# Patient Record
Sex: Male | Born: 1970 | Race: Black or African American | Hispanic: No | Marital: Single | Smoking: Never smoker
Health system: Southern US, Community
[De-identification: ages and names within clinical notes are randomized; demographics above are authoritative.]

---

## 2020-04-09 ENCOUNTER — Ambulatory Visit (INDEPENDENT_AMBULATORY_CARE_PROVIDER_SITE_OTHER)
Admission: EM | Admit: 2020-04-09 | Discharge: 2020-04-09 | Disposition: A | Discharge status: Home / Self Care | Payer: Commercial Managed Care - PPO | Source: Home / Self Care

## 2020-04-09 ENCOUNTER — Encounter (HOSPITAL_COMMUNITY): Payer: Self-pay

## 2020-04-09 ENCOUNTER — Other Ambulatory Visit: Payer: Self-pay

## 2020-04-09 ENCOUNTER — Emergency Department (HOSPITAL_COMMUNITY)
Admission: EM | Admit: 2020-04-09 | Discharge: 2020-04-10 | Disposition: A | Discharge status: Home / Self Care | Payer: Commercial Managed Care - PPO | Attending: Emergency Medicine | Admitting: Emergency Medicine

## 2020-04-09 DIAGNOSIS — R82998 Other abnormal findings in urine: Secondary | ICD-10-CM

## 2020-04-09 DIAGNOSIS — R1031 Right lower quadrant pain: Secondary | ICD-10-CM | POA: Diagnosis not present

## 2020-04-09 DIAGNOSIS — R34 Anuria and oliguria: Secondary | ICD-10-CM

## 2020-04-09 DIAGNOSIS — N179 Acute kidney failure, unspecified: Secondary | ICD-10-CM | POA: Diagnosis not present

## 2020-04-09 LAB — CBC
HCT: 44.1 % (ref 39.0–52.0)
Hemoglobin: 14 g/dL (ref 13.0–17.0)
MCH: 26.3 pg (ref 26.0–34.0)
MCHC: 31.7 g/dL (ref 30.0–36.0)
MCV: 82.9 fL (ref 80.0–100.0)
Platelets: 381 10*3/uL (ref 150–400)
RBC: 5.32 MIL/uL (ref 4.22–5.81)
RDW: 14.2 % (ref 11.5–15.5)
WBC: 8.3 10*3/uL (ref 4.0–10.5)
nRBC: 0 % (ref 0.0–0.2)

## 2020-04-09 LAB — BASIC METABOLIC PANEL
Anion gap: 12 (ref 5–15)
BUN: 8 mg/dL (ref 6–20)
CO2: 24 mmol/L (ref 22–32)
Calcium: 9 mg/dL (ref 8.9–10.3)
Chloride: 104 mmol/L (ref 98–111)
Creatinine, Ser: 1.3 mg/dL — ABNORMAL HIGH (ref 0.61–1.24)
GFR calc Af Amer: >60 mL/min (ref >60)
GFR calc non Af Amer: >60 mL/min (ref >60)
Glucose, Bld: 92 mg/dL (ref 70–99)
Potassium: 3.4 mmol/L — ABNORMAL LOW (ref 3.5–5.1)
Sodium: 140 mmol/L (ref 135–145)

## 2020-04-09 LAB — URINALYSIS, ROUTINE W REFLEX MICROSCOPIC
Bacteria, UA: NONE SEEN
Bilirubin Urine: NEGATIVE
Glucose, UA: NEGATIVE mg/dL
Hgb urine dipstick: NEGATIVE
Ketones, ur: 20 mg/dL — AB
Leukocytes,Ua: NEGATIVE
Nitrite: NEGATIVE
Protein, ur: 100 mg/dL — AB
Specific Gravity, Urine: 1.028 (ref 1.005–1.030)
pH: 5 (ref 5.0–8.0)

## 2020-04-09 NOTE — ED Triage Notes (Signed)
Pt presents with complaints of decreased urinary output for 3 days. States he has only been going to the bathroom 2 times a day with very little urine output. Pt reports feeling a little sweaty today and right flank pain that started today. States urine varies from a dark color to a light clear color.

## 2020-04-09 NOTE — ED Provider Notes (Signed)
[facility]    CSN: [id number] Arrival date & time: 04/09/20  1539      History   Chief Complaint Chief Complaint  Patient presents with  . Decrease in Urine Output    HPI Sean Shah is a 49 y.o. male.   Patient reports urgent care for decreased urination.  He reports for the last 2 to 3 days he has gradually had less and less urine production.  He reports yesterday he made up to maybe 16 ounces of urine.  He reports the day he made possibly between 80 and 100 mL of urine total.  He reports generally he has to urinate 2-3 times a night however the night before last he did not wake at all.  And yesterday morning he did not have to urinate.  He reports dark color urine, described as brown at times.  He reports he had been seeing dark urine brown at times over the last 2 to 3 weeks.  He reports he does not feel like he needs to urinate at all.  He reports he does not have issues starting to urinate when he does have to urinate.  He also reports some dull right-sided low back pain.  Denies fever, chills.  Denies painful urination.  Denies any chest pain or shortness of breath.  Denies lower extremity swelling.  Reports he has not seen a provider in about 5 years.  Reports he is otherwise been fairly healthy.     History reviewed. No pertinent past medical history.  There are no problems to display for this patient.   History reviewed. No pertinent surgical history.     Home Medications    Prior to Admission medications   Not on File    Family History Family History  Problem Relation Age of Onset  . Cancer Mother   . Vascular Disease Father     Social History Social History   Tobacco Use  . Smoking status: Never Smoker  . Smokeless tobacco: Never Used  Substance Use Topics  . Alcohol use: Yes    Comment: rarely  . Drug use: Never     Allergies   Patient has no known allergies.   Review of Systems Review of Systems   Physical Exam Triage  Vital Signs ED Triage Vitals  Enc Vitals Group     BP 04/09/20 1625 (!) 152/99     Pulse Rate 04/09/20 1625 91     Resp 04/09/20 1625 18     Temp 04/09/20 1625 98.7 F (37.1 C)     Temp Source 04/09/20 1625 Oral     SpO2 04/09/20 1625 99 %     Weight --      Height --      Head Circumference --      Peak Flow --      Pain Score 04/09/20 1641 3     Pain Loc --      Pain Edu? --      Excl. in GC? --    No data found.  Updated Vital Signs BP (!) 152/99 (BP Location: Left Arm)   Pulse 91   Temp 98.7 F (37.1 C) (Oral)   Resp 18   SpO2 99%   Visual Acuity Right Eye Distance:   Left Eye Distance:   Bilateral Distance:    Right Eye Near:   Left Eye Near:    Bilateral Near:     Physical Exam Vitals and nursing note reviewed.  Constitutional:  Appearance: He is well-developed.  HENT:     Head: Normocephalic and atraumatic.  Eyes:     Conjunctiva/sclera: Conjunctivae normal.  Cardiovascular:     Rate and Rhythm: Normal rate and regular rhythm.     Heart sounds: No murmur.  Pulmonary:     Effort: Pulmonary effort is normal. No respiratory distress.  Abdominal:     Tenderness: There is no abdominal tenderness. There is no right CVA tenderness or left CVA tenderness.  Musculoskeletal:     Cervical back: Neck supple.     Right lower leg: No edema.     Left lower leg: No edema.  Skin:    General: Skin is warm and dry.  Neurological:     Mental Status: He is alert.      UC Treatments / Results  Labs (all labs ordered are listed, but only abnormal results are displayed) Labs Reviewed - No data to display  EKG   Radiology No results found.  Procedures Procedures (including critical care time)  Medications Ordered in UC Medications - No data to display  Initial Impression / Assessment and Plan / UC Course  I have reviewed the triage vital signs and the nursing notes.  Pertinent labs & imaging results that were available during my care of the  patient were reviewed by me and considered in my medical decision making (see chart for details).     # oliguria #Dark brown urine Patient is a 49 year old presenting with Foley area and dark urine.  Patient was unable to make any urine in clinic for urinalysis.  Given likely less than 100 mL made an entire day will send to the emergency department for further evaluation for concern of renal failure versus urinary tract obstruction at this time.  Patient has a stable vital signs at this time.  He will self transport to [facility] emergency department for further evaluation.  Final Clinical Impressions(s) / UC Diagnoses   Final diagnoses:  Dark brown urine  Oliguria     Discharge Instructions     Given how little urine you are making, you need to be further evaluated in the Emergency Department .    ED Prescriptions    None     PDMP not reviewed this encounter.   Purnell Shoemaker, PA-C 04/09/20 1721

## 2020-04-09 NOTE — Discharge Instructions (Signed)
Given how little urine you are making, you need to be further evaluated in the Emergency Department .

## 2020-04-09 NOTE — ED Triage Notes (Signed)
Patient complains of decreased urination and decreased urge to urinate x 3 days. Complains of mild right flank pain for same.

## 2020-04-09 NOTE — ED Notes (Signed)
Patient is being discharged from the Urgent Care and sent to the Emergency Department via POV . Per Omer Jack, PA, patient is in need of higher level of care due to inability to urinate. Patient is aware and verbalizes understanding of plan of care.  Vitals:   04/09/20 1625  BP: (!) 152/99  Pulse: 91  Resp: 18  Temp: 98.7 F (37.1 C)  SpO2: 99%

## 2020-04-09 NOTE — ED Notes (Signed)
Patient unable to void 

## 2020-04-10 ENCOUNTER — Emergency Department (HOSPITAL_COMMUNITY): Payer: Commercial Managed Care - PPO

## 2020-04-10 ENCOUNTER — Encounter (HOSPITAL_COMMUNITY): Payer: Self-pay | Admitting: Radiology

## 2020-04-10 MED ORDER — SODIUM CHLORIDE 0.9 % IV BOLUS (SEPSIS)
1000.0000 mL | Freq: Once | INTRAVENOUS | Status: AC
  Administered 2020-04-10: 1000 mL via INTRAVENOUS

## 2020-04-10 NOTE — ED Notes (Signed)
Pt to CT

## 2020-04-10 NOTE — ED Notes (Signed)
 Bladder Scan

## 2020-04-10 NOTE — ED Provider Notes (Signed)
TIME SEEN: 12:14 AM  CHIEF COMPLAINT: Decreased urination for the past several days, right flank pain  HPI: Patient is a 49 year old male with no significant past medical history who presents to the emergency department decreased urination for the past several days.  Reports over the past day he is only urinated twice and it has been less than normal and darker in color.  He does report some mild intermittent right flank pain but none currently.  No fevers, nausea, vomiting, abdominal pain.  Has had some diarrhea for the past 2 weeks but now reports stool is back to normal.  No history of kidney stones.  No history of prostate issues.  He states he feels he has been eating and drinking normally.  He has not been out in the heat recently.  ROS: See HPI Constitutional: no fever  Eyes: no drainage  ENT: no runny nose   Cardiovascular:  no chest pain  Resp: no SOB  GI: no vomiting GU: no dysuria Integumentary: no rash  Allergy: no hives  Musculoskeletal: no leg swelling  Neurological: no slurred speech ROS otherwise negative  PAST MEDICAL HISTORY/PAST SURGICAL HISTORY:  History reviewed. No pertinent past medical history.  MEDICATIONS:  Prior to Admission medications   Not on File    ALLERGIES:  No Known Allergies  SOCIAL HISTORY:  Social History   Tobacco Use  . Smoking status: Never Smoker  . Smokeless tobacco: Never Used  Substance Use Topics  . Alcohol use: Yes    Comment: rarely    FAMILY HISTORY: Family History  Problem Relation Age of Onset  . Cancer Mother   . Vascular Disease Father     EXAM: BP (!) 158/100   Pulse 71   Temp 98.5 F (36.9 C) (Oral)   Resp 19   Ht 6' (1.829 m)   Wt 127 kg   SpO2 100%   BMI 37.97 kg/m  CONSTITUTIONAL: Alert and oriented and responds appropriately to questions. Well-appearing; well-nourished HEAD: Normocephalic EYES: Conjunctivae clear, pupils appear equal, EOM appear intact ENT: normal nose; moist mucous  membranes NECK: Supple, normal ROM CARD: RRR; S1 and S2 appreciated; no murmurs, no clicks, no rubs, no gallops RESP: Normal chest excursion without splinting or tachypnea; breath sounds clear and equal bilaterally; no wheezes, no rhonchi, no rales, no hypoxia or respiratory distress, speaking full sentences ABD/GI: Normal bowel sounds; non-distended; soft, non-tender, no rebound, no guarding, no peritoneal signs, no hepatosplenomegaly BACK:  The back appears normal EXT: Normal ROM in all joints; no deformity noted, no edema; no cyanosis SKIN: Normal color for age and race; warm; no rash on exposed skin NEURO: Moves all extremities equally PSYCH: The patient's mood and manner are appropriate.   MEDICAL DECISION MAKING: Patient here with decreased urination.  Labs reviewed/interpreted and show mild elevation of creatinine at 1.3 with no old for comparison.  Urine shows small amount of ketones but no sign of infection.  Will hydrate patient given his elevated creatinine ketones in his urine, bladder scan and obtained CT renal study to evaluate for stone or other acute abnormality.  Patient has been able to urinate on his own here.  ED PROGRESS: CT scan reviewed/interpreted and shows no acute abnormality.  Will discharge after IV fluids.  He is drinking here without difficulty.  Will provide PCP follow-up information.  Recommended recheck of creatinine in 1 to 2 weeks.  Recommended increase fluid intake at home and avoiding NSAIDs.  He is comfortable with this plan.  He  has no significant urinary retention on bladder scan.  Bladder scan revealed 100 mL.  He has been able to urinate here.   At this time, I do not feel there is any life-threatening condition present. I have reviewed, interpreted and discussed all results (EKG, imaging, lab, urine as appropriate) and exam findings with patient/family. I have reviewed nursing notes and appropriate previous records.  I feel the patient is safe to be  discharged home without further emergent workup and can continue workup as an outpatient as needed. Discussed usual and customary return precautions. Patient/family verbalize understanding and are comfortable with this plan.  Outpatient follow-up has been provided as needed. All questions have been answered.   Sean Shah was evaluated in Emergency Department on 04/10/2020 for the symptoms described in the history of present illness. He was evaluated in the context of the global COVID-19 pandemic, which necessitated consideration that the patient might be at risk for infection with the SARS-CoV-2 virus that causes COVID-19. Institutional protocols and algorithms that pertain to the evaluation of patients at risk for COVID-19 are in a state of rapid change based on information released by regulatory bodies including the CDC and federal and state organizations. These policies and algorithms were followed during the patient's care in the ED.      Pete Merten, Delice Bison, DO 04/10/20 [phone]

## 2020-04-10 NOTE — ED Notes (Signed)
Discharge instructions discussed with pt. Pt. Verbalized instructions with no questions at this time.

## 2020-04-10 NOTE — Discharge Instructions (Addendum)
Your creatinine (kidney function) was slightly elevated at 1.3 today.  We have no old for comparison.  Your urine showed small amount of ketones.  This is all suggestive of mild dehydration which may be contributing to decreased urination.  Please increase your water intake at home and I recommend follow-up with a primary care physician in 1 to 2 weeks to have your labs rechecked.  Otherwise your labs, imaging, urine today were reassuring.  No sign of kidney stone, obstruction, infection.  Steps to find a Primary Care Provider (PCP):  Call [phone] or [phone] to access "[place] Find a Doctor Service."  2.  You may also go on the [facility] website at [email or web address]  3.  [facility] also frequently accepts new patients.  [facility]  [address] [place] [address] [phone]  4.  There are also multiple [facility], [facility] and [facility] practices throughout the [place] that are frequently accepting new patients. You may find a clinic that is close to your home and contact them.  [facility] [email or web address] [phone]  [facility] [email or web address]  [email or web address] [phone]  [facility] [email or web address] [phone]  5.  Local Health Departments also can provide primary care services.  [facility]  [address] [place] [address] [phone]  [facility] Department [address] [place] [address] [phone]  [facility] [address] [place] [address]  [place] [address] [phone]

## 2020-04-11 ENCOUNTER — Other Ambulatory Visit: Payer: Self-pay

## 2020-04-11 ENCOUNTER — Ambulatory Visit (HOSPITAL_COMMUNITY)
Admission: EM | Admit: 2020-04-11 | Discharge: 2020-04-11 | Disposition: A | Discharge status: Home / Self Care | Payer: Commercial Managed Care - PPO | Attending: Internal Medicine | Admitting: Internal Medicine

## 2020-04-11 ENCOUNTER — Encounter (HOSPITAL_COMMUNITY): Payer: Self-pay

## 2020-04-11 DIAGNOSIS — K648 Other hemorrhoids: Secondary | ICD-10-CM | POA: Diagnosis not present

## 2020-04-11 MED ORDER — HYDROCORTISONE ACETATE 25 MG RE SUPP
25.0000 mg | Freq: Two times a day (BID) | RECTAL | 0 refills | Status: DC
Start: 2020-04-11 — End: 2021-07-09

## 2020-04-11 NOTE — ED Triage Notes (Signed)
Pt c/o bloody stools this AM, has history of hemorrhoids but states today was more blood than usual. Denies pain. Also reports having diarrhea x 2-3 weeks

## 2020-04-11 NOTE — ED Provider Notes (Signed)
[facility]    CSN: [id number] Arrival date & time: 04/11/20  0801      History   Chief Complaint Chief Complaint  Patient presents with  . Rectal Bleeding    HPI Sean Shah is a 49 y.o. male comes to the urgent care with complaints of blood in stool this morning.  Patient says that the blood was on the stool.  No pain with bowel movement.  He also saw some blood on the toilet paper.  No abdominal pain, nausea or vomiting.  Patient has experienced diarrhea over the past 2 to 3 weeks.  This is the first episode of such bleeding.  No weight loss.  No dizziness.  Patient has no history of hemorrhoids.  Last colonoscopy was 9 years ago.  Patient was seen in the emergency department for kidney stone and he had CT of the abdomen done which was unremarkable.  No colonic masses noted.   HPI  History reviewed. No pertinent past medical history.  There are no problems to display for this patient.   History reviewed. No pertinent surgical history.     Home Medications    Prior to Admission medications   Medication Sig Start Date End Date Taking? Authorizing Provider  hydrocortisone (ANUSOL-HC) 25 MG suppository Place 1 suppository (25 mg total) rectally 2 (two) times daily. 04/11/20   LampteyBritta Mccreedy, MD    Family History Family History  Problem Relation Age of Onset  . Cancer Mother   . Vascular Disease Father     Social History Social History   Tobacco Use  . Smoking status: Never Smoker  . Smokeless tobacco: Never Used  Substance Use Topics  . Alcohol use: Yes    Comment: rarely  . Drug use: Never     Allergies   Patient has no known allergies.   Review of Systems Review of Systems  Constitutional: Negative for activity change, diaphoresis, fatigue and fever.  Respiratory: Negative.   Gastrointestinal: Positive for blood in stool. Negative for abdominal pain, diarrhea, nausea and vomiting.  Genitourinary: Negative.   Musculoskeletal: Negative.    Neurological: Negative for dizziness, light-headedness and headaches.     Physical Exam Triage Vital Signs ED Triage Vitals  Enc Vitals Group     BP 04/11/20 0816 (!) 167/106     Pulse Rate 04/11/20 0816 89     Resp 04/11/20 0816 16     Temp 04/11/20 0816 98.7 F (37.1 C)     Temp src --      SpO2 04/11/20 0816 99 %     Weight --      Height --      Head Circumference --      Peak Flow --      Pain Score 04/11/20 0817 0     Pain Loc --      Pain Edu? --      Excl. in GC? --    No data found.  Updated Vital Signs BP (!) 167/106   Pulse 89   Temp 98.7 F (37.1 C)   Resp 16   SpO2 99%   Visual Acuity Right Eye Distance:   Left Eye Distance:   Bilateral Distance:    Right Eye Near:   Left Eye Near:    Bilateral Near:     Physical Exam Vitals and nursing note reviewed.  Constitutional:      Appearance: Normal appearance.  Cardiovascular:     Rate and Rhythm: Normal rate and  regular rhythm.  Pulmonary:     Effort: Pulmonary effort is normal.     Breath sounds: Normal breath sounds.  Abdominal:     General: Bowel sounds are normal.     Palpations: Abdomen is soft.     Tenderness: There is no abdominal tenderness.  Neurological:     Mental Status: He is alert.      UC Treatments / Results  Labs (all labs ordered are listed, but only abnormal results are displayed) Labs Reviewed - No data to display  EKG   Radiology CT Renal Stone Study  Result Date: 04/10/2020 CLINICAL DATA:  Right-sided flank pain with decreased urine output EXAM: CT ABDOMEN AND PELVIS WITHOUT CONTRAST TECHNIQUE: Multidetector CT imaging of the abdomen and pelvis was performed following the standard protocol without IV contrast. COMPARISON:  None. FINDINGS: Lower chest: No acute abnormality. Hepatobiliary: Fatty infiltration of the liver is noted. The gallbladder is within normal limits. Pancreas: Unremarkable. No pancreatic ductal dilatation or surrounding inflammatory changes.  Spleen: Normal in size without focal abnormality. Adrenals/Urinary Tract: Adrenal glands are unremarkable. Kidneys demonstrate no renal calculi or obstructive changes. The ureters are within normal limits bilaterally. The bladder is partially distended. Stomach/Bowel: No obstructive or inflammatory changes of the colon are seen. The appendix is within normal limits. Small bowel and stomach are unremarkable. Vascular/Lymphatic: No significant vascular findings are present. No enlarged abdominal or pelvic lymph nodes. Reproductive: Prostate is unremarkable. Other: No abdominal wall hernia or abnormality. No abdominopelvic ascites. Musculoskeletal: No acute or significant osseous findings. IMPRESSION: No acute abnormality noted. Electronically Signed   By: Inez Catalina M.D.   On: 04/10/2020 00:55    Procedures Procedures (including critical care time)  Medications Ordered in UC Medications - No data to display  Initial Impression / Assessment and Plan / UC Course  I have reviewed the triage vital signs and the nursing notes.  Pertinent labs & imaging results that were available during my care of the patient were reviewed by me and considered in my medical decision making (see chart for details).     1.  Bleeding internal hemorrhoids: CBC done yesterday showed a hemoglobin of 14.0. Renal function was normal BUN was normal. CT scan of the abdomen done yesterday showed normal colon  Final Clinical Impressions(s) / UC Diagnoses   Final diagnoses:  Bleeding internal hemorrhoids   Discharge Instructions   None    ED Prescriptions    Medication Sig Dispense Auth. Provider   hydrocortisone (ANUSOL-HC) 25 MG suppository Place 1 suppository (25 mg total) rectally 2 (two) times daily. 12 suppository Kaija Kovacevic, Myrene Galas, MD     PDMP not reviewed this encounter.   Chase Picket, MD 04/12/20 1037

## 2020-11-28 ENCOUNTER — Ambulatory Visit (HOSPITAL_COMMUNITY)
Admission: EM | Admit: 2020-11-28 | Discharge: 2020-11-28 | Disposition: A | Discharge status: Home / Self Care | Payer: Self-pay | Attending: Family Medicine | Admitting: Family Medicine

## 2020-11-28 ENCOUNTER — Other Ambulatory Visit: Payer: Self-pay

## 2020-11-28 ENCOUNTER — Encounter (HOSPITAL_COMMUNITY): Payer: Self-pay

## 2020-11-28 DIAGNOSIS — R030 Elevated blood-pressure reading, without diagnosis of hypertension: Secondary | ICD-10-CM

## 2020-11-28 MED ORDER — AMLODIPINE BESYLATE 5 MG PO TABS
5.0000 mg | ORAL_TABLET | Freq: Every day | ORAL | 1 refills | Status: DC
Start: 2020-11-28 — End: 2021-01-15

## 2020-11-28 NOTE — ED Provider Notes (Signed)
[facility]    CSN: [id number] Arrival date & time: 11/28/20  0848      History   Chief Complaint Chief Complaint  Patient presents with  . Hypertension    HPI Sean Shah is a 50 y.o. male.   Here today with concerns regarding his blood pressure, which he states has been high for several years or more but he has never sought care prior to this. States the readings have been higher lately which caused him concern. Had a headache a week or so ago but nothing since. Denies CP, SOB, dizziness, current headaches, visual changes. States he's never been diagnosed with HTN or treated with anything. Does not have a PCP or insurance at the moment.      History reviewed. No pertinent past medical history.  There are no problems to display for this patient.   History reviewed. No pertinent surgical history.     Home Medications    Prior to Admission medications   Medication Sig Start Date End Date Taking? Authorizing Provider  amLODipine (NORVASC) 5 MG tablet Take 1 tablet (5 mg total) by mouth daily. 11/28/20  Yes Particia Nearing, PA-C  hydrocortisone (ANUSOL-HC) 25 MG suppository Place 1 suppository (25 mg total) rectally 2 (two) times daily. 04/11/20   LampteyBritta Mccreedy, MD    Family History Family History  Problem Relation Age of Onset  . Cancer Mother   . Vascular Disease Father     Social History Social History   Tobacco Use  . Smoking status: Never Smoker  . Smokeless tobacco: Never Used  Substance Use Topics  . Alcohol use: Yes    Comment: rarely  . Drug use: Never     Allergies   Patient has no known allergies.   Review of Systems Review of Systems PER HPI    Physical Exam Triage Vital Signs ED Triage Vitals  Enc Vitals Group     BP 11/28/20 1016 (!) 195/144     Pulse Rate 11/28/20 1016 90     Resp 11/28/20 1016 18     Temp 11/28/20 1016 98.8 F (37.1 C)     Temp Source 11/28/20 1016 Oral     SpO2 11/28/20 1016 96 %      Weight --      Height --      Head Circumference --      Peak Flow --      Pain Score 11/28/20 1015 0     Pain Loc --      Pain Edu? --      Excl. in GC? --    No data found.  Updated Vital Signs BP (!) 195/144 (BP Location: Right Arm)   Pulse 90   Temp 98.8 F (37.1 C) (Oral)   Resp 18   SpO2 96%   Visual Acuity Right Eye Distance:   Left Eye Distance:   Bilateral Distance:    Right Eye Near:   Left Eye Near:    Bilateral Near:     Physical Exam Vitals and nursing note reviewed.  Constitutional:      Appearance: Normal appearance.  HENT:     Head: Atraumatic.  Eyes:     Extraocular Movements: Extraocular movements intact.     Conjunctiva/sclera: Conjunctivae normal.  Cardiovascular:     Rate and Rhythm: Normal rate and regular rhythm.     Heart sounds: Normal heart sounds.  Pulmonary:     Effort: Pulmonary effort is normal. No respiratory  distress.     Breath sounds: Normal breath sounds.  Abdominal:     General: Bowel sounds are normal.     Palpations: Abdomen is soft.  Musculoskeletal:        General: Normal range of motion.     Cervical back: Normal range of motion and neck supple.  Skin:    General: Skin is warm and dry.  Neurological:     General: No focal deficit present.     Mental Status: He is oriented to person, place, and time.  Psychiatric:        Mood and Affect: Mood normal.        Thought Content: Thought content normal.        Judgment: Judgment normal.      UC Treatments / Results  Labs (all labs ordered are listed, but only abnormal results are displayed) Labs Reviewed - No data to display  EKG   Radiology No results found.  Procedures Procedures (including critical care time)  Medications Ordered in UC Medications - No data to display  Initial Impression / Assessment and Plan / UC Course  I have reviewed the triage vital signs and the nursing notes.  Pertinent labs & imaging results that were available during my  care of the patient were reviewed by me and considered in my medical decision making (see chart for details).     Asymptomatic currently, well appearing on exam. BP significantly elevated today. Looking back it appears to be elevated historically at each visit to a lesser extent as well. Will start amlodipine, have him log home readings, and establish care ASAP with PCP for recheck and ongoing mgmt. He declines labs today though this was recommended. Strict return precautions, lifestyle changes reviewed.   Final Clinical Impressions(s) / UC Diagnoses   Final diagnoses:  Elevated blood-pressure reading without diagnosis of hypertension   Discharge Instructions   None    ED Prescriptions    Medication Sig Dispense Auth. Provider   amLODipine (NORVASC) 5 MG tablet Take 1 tablet (5 mg total) by mouth daily. 30 tablet Particia Nearing, [place]     PDMP not reviewed this encounter.   Particia Nearing, [place] 11/28/20 1049

## 2020-11-28 NOTE — ED Triage Notes (Signed)
Pt presents for elevated blood pressure. Pt can  Not remember the BP he checked at home. States had headache  4 days ago. Currently denies vision changes, headaches, nausea, chest pain, dizziness.  States he wants prescription for elevated high blood pressure, he never had any prescription before.

## 2021-01-15 ENCOUNTER — Other Ambulatory Visit: Payer: Self-pay

## 2021-01-15 ENCOUNTER — Ambulatory Visit: Payer: Self-pay | Admitting: Student

## 2021-01-15 ENCOUNTER — Encounter: Payer: Self-pay | Admitting: Student

## 2021-01-15 ENCOUNTER — Other Ambulatory Visit (HOSPITAL_COMMUNITY): Payer: Self-pay | Admitting: Student

## 2021-01-15 VITALS — BP 160/101 | HR 76 | Temp 98.4°F | Ht 72.0 in | Wt 274.4 lb

## 2021-01-15 DIAGNOSIS — I10 Essential (primary) hypertension: Secondary | ICD-10-CM

## 2021-01-15 MED ORDER — AMLODIPINE BESYLATE 10 MG PO TABS: 10.0000 mg | ORAL_TABLET | Freq: Every day | ORAL | 2 refills | Status: DC

## 2021-01-15 MED FILL — AMLODIPINE BESYLATE 10 MG T: 10 | 30 days supply | Qty: 30 | Fill #0

## 2021-01-15 NOTE — Progress Notes (Signed)
   CC: Elevated blood pressure  HPI:  [name] is a 50 y.o. with no past medical history who came to our clinic to establish care and hypertension management.  Patient was told that he had high blood pressure when he went to his colonoscopy.  Patient recently came to the ED in January for his blood pressure.  Blood pressure checked then was 195/144.  He was asymptomatic.  ED provider started him on amlodipine 5 mg, which patient is compliant with.  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 No pertinent past medical history.   Review of Systems:    Review of Systems  Constitutional: Negative.   Eyes: Negative.   Respiratory: Negative for shortness of breath.   Cardiovascular: Negative for chest pain and leg swelling.  Neurological: Negative for weakness.    Physical Exam:  Vitals:   01/15/21 1353 01/15/21 1425  BP: (!) 154/99 (!) 160/101  Pulse: 86 76  Temp: 98.4 F (36.9 C)   TempSrc: Oral   SpO2: 100%   Weight: 274 lb 6.4 oz (124.5 kg)   Height: 6' (1.829 m)    Physical Exam Constitutional:      General: He is not in acute distress.    Appearance: Normal appearance.  HENT:     Head: Normocephalic.  Eyes:     General:        Right eye: No discharge.        Left eye: No discharge.  Cardiovascular:     Rate and Rhythm: Normal rate and regular rhythm.  Pulmonary:     Effort: Pulmonary effort is normal. No respiratory distress.  Abdominal:     General: Bowel sounds are normal.     Tenderness: There is no abdominal tenderness.  Musculoskeletal:     Right lower leg: No edema.     Left lower leg: No edema.  Skin:    General: Skin is warm.  Neurological:     Mental Status: He is alert.  Psychiatric:        Mood and Affect: Mood normal.      Assessment & Plan:   See Encounters Tab for problem based charting.  Patient discussed with Dr. Heide Spark

## 2021-01-15 NOTE — Assessment & Plan Note (Addendum)
Patient was found to have elevated blood pressure in multiple settings.  He was started on amlodipine 5 mg in the ED.  Patient reports compliant with his medication.  Blood pressure checked in the clinic was 154/99.  We had a long discussion about the risk of having elevated blood pressure.  I explained to him that he will require 2 medications to control his blood pressure.  Patient was reluctant with taking 2 medications for blood pressure.  He requests to increase his amlodipine to 10 mg.  Patient was advised to reduce salt intake and increase exercise.  Patient also had a blood pressure log to keep at home.  Assessment and plan Will increase amlodipine to 10 mg.  Advised patient to keep monitoring blood pressure at home and bring his logs to the next visit.  His BMP last year showed creatinine of 1.3 with unknown baseline.  His serum creatinine can be elevated secondary to increased muscle mass.  We will repeat BMP when he has an orange card.  -Amlodipine 10 mg -BMP, A1c, lipid panel next visit

## 2021-01-15 NOTE — Patient Instructions (Addendum)
Sean Shah,  It is a pleasure seeing you in the clinic today.    Elevated blood pressure: I will increase your amlodipine to 10 mg.  Please measure your blood pressure at home and bring a blood pressure log with you to the next visit.  You can pick up your medication at the outpatient pharmacy.  It should cost $4.  When you are approved for orange card, we will get some blood work to check for cholesterol, sugar and kidney function.  Please call the office for any questions or concerns   Dr. Cyndie Chime  Please set up an appointment with our financial counselor for orange card

## 2021-01-17 ENCOUNTER — Other Ambulatory Visit: Payer: Self-pay | Admitting: Family Medicine

## 2021-01-17 NOTE — Progress Notes (Signed)
Internal Medicine Clinic Attending  Case discussed with Dr. Nguyen  At the time of the visit.  We reviewed the resident's history and exam and pertinent patient test results.  I agree with the assessment, diagnosis, and plan of care documented in the resident's note. 

## 2021-02-17 ENCOUNTER — Other Ambulatory Visit (HOSPITAL_COMMUNITY): Payer: Self-pay

## 2021-02-17 MED FILL — Amlodipine Besylate Tab 10 MG (Base Equivalent): ORAL | 30 days supply | Qty: 30 | Fill #0 | Status: AC

## 2021-03-18 ENCOUNTER — Other Ambulatory Visit (HOSPITAL_COMMUNITY): Payer: Self-pay

## 2021-03-18 MED FILL — Amlodipine Besylate Tab 10 MG (Base Equivalent): ORAL | 30 days supply | Qty: 30 | Fill #1 | Status: AC

## 2021-03-28 ENCOUNTER — Encounter: Payer: Self-pay | Admitting: Student

## 2021-03-30 IMAGING — CT CT RENAL STONE PROTOCOL
2 of 4 series · 17 of 46 positions shown, 19 images · non-contrast
Comparison: None.

CLINICAL DATA: Right-sided flank pain with decreased urine output

EXAM:
CT ABDOMEN AND PELVIS WITHOUT CONTRAST
TECHNIQUE: Multidetector CT imaging of the abdomen and pelvis was performed
following the standard protocol without IV contrast.

[Series 3: ap without · axial · non-contrast · 0.86mm/px · z∈[+644,+1079]mm · 14 of 99 slices shown, 16 images]
[im 6/99  soft-tissue]
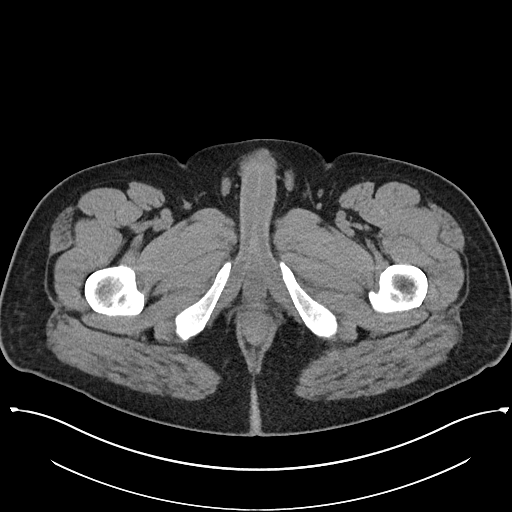
[im 6/99  bone]
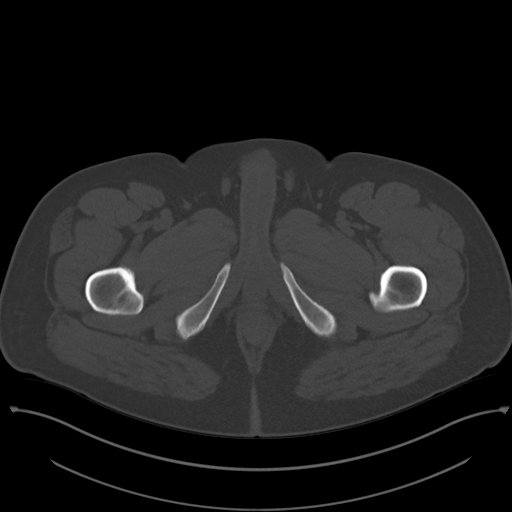
[im 11/99  soft-tissue]
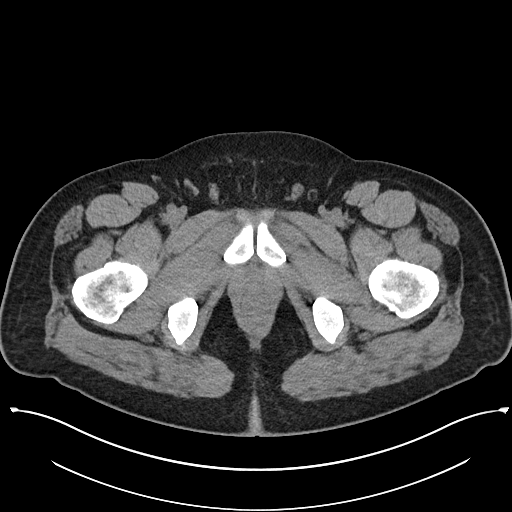
[im 22/99  soft-tissue]
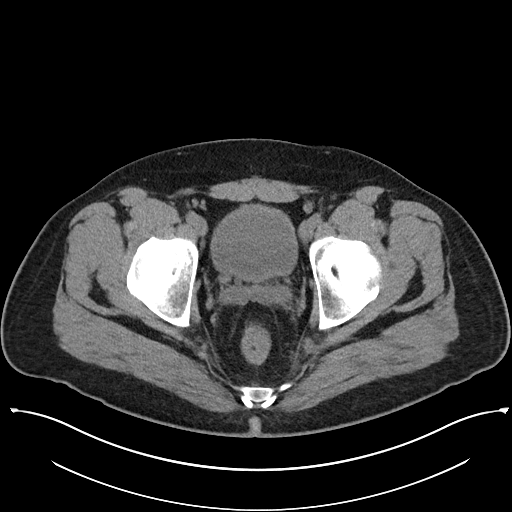
[im 28/99  soft-tissue]
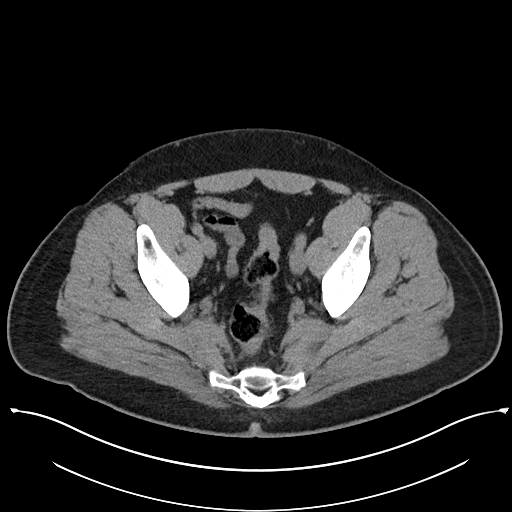
[im 33/99  soft-tissue]
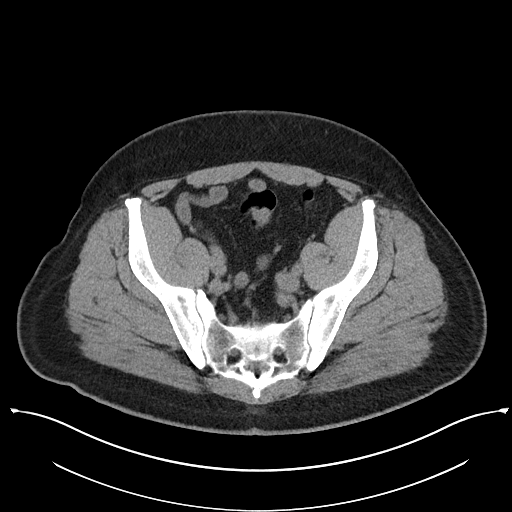
[im 39/99  soft-tissue]
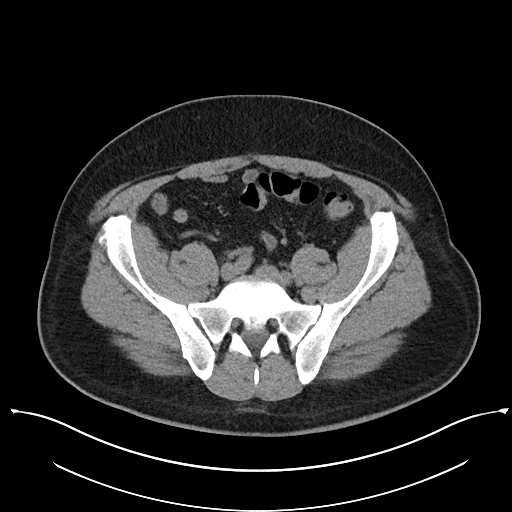
[im 44/99  soft-tissue]
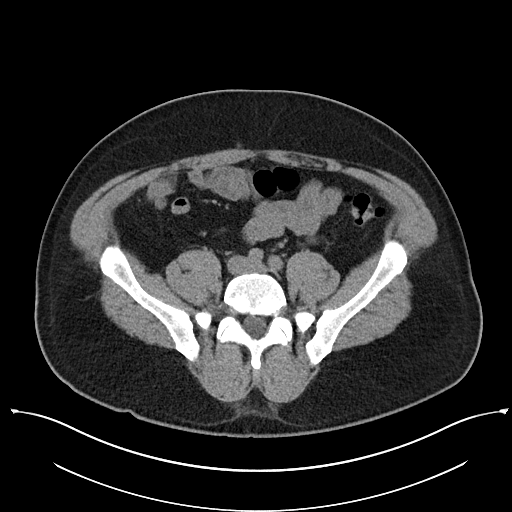
[im 55/99  soft-tissue]
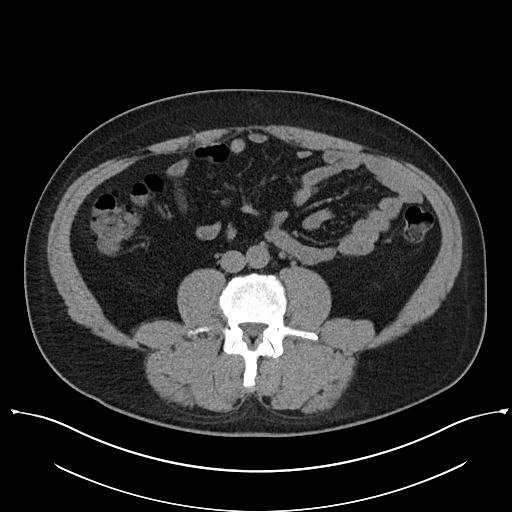
[im 60/99  soft-tissue]
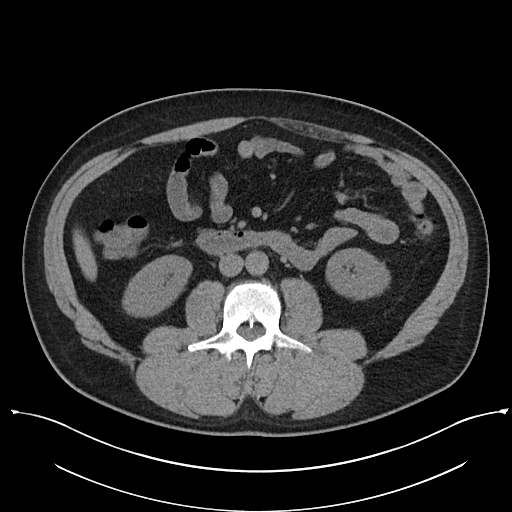
[im 60/99  bone]
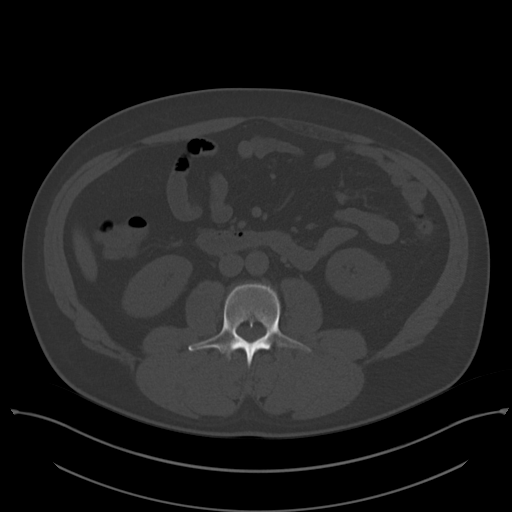
[im 66/99  soft-tissue]
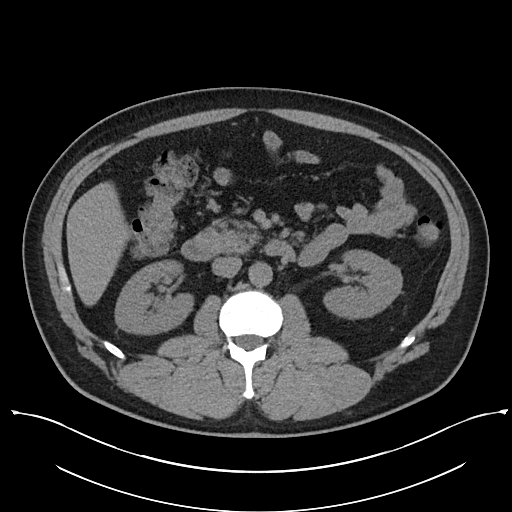
[im 71/99  soft-tissue]
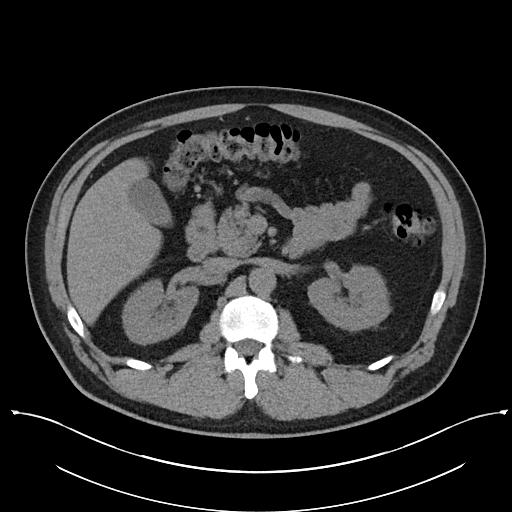
[im 77/99  soft-tissue]
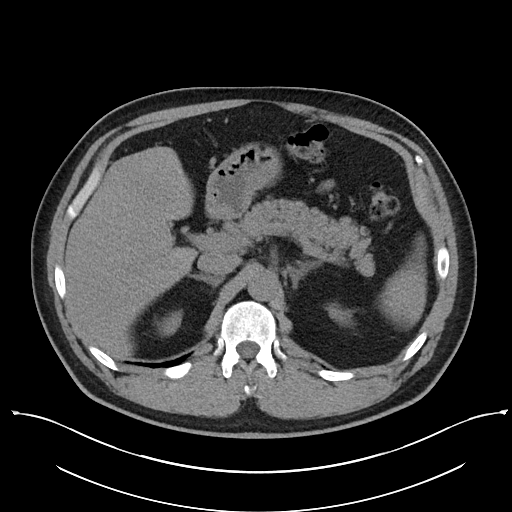
[im 88/99  soft-tissue]
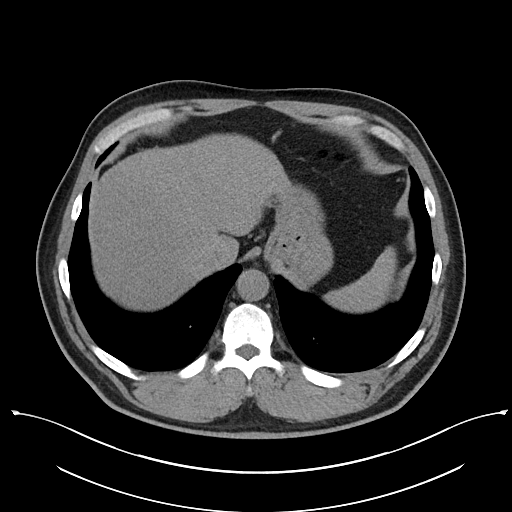
[im 93/99  soft-tissue]
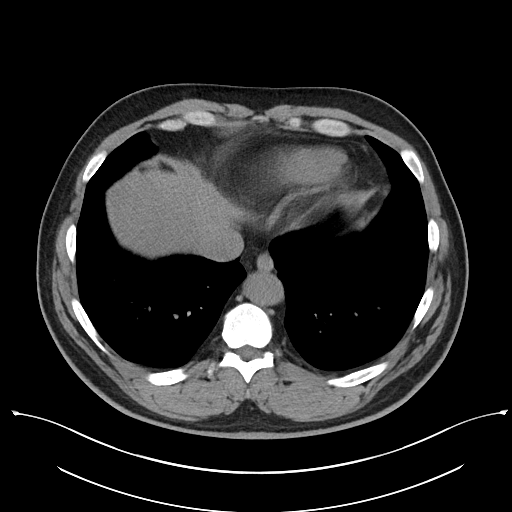

[Series 6: cor · coronal · 0.87mm/px · 3 of 107 slices shown]
[im 36/107  soft-tissue]
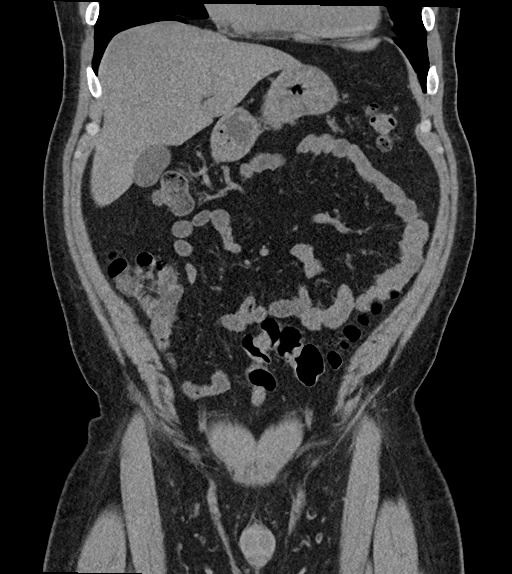
[im 48/107  soft-tissue]
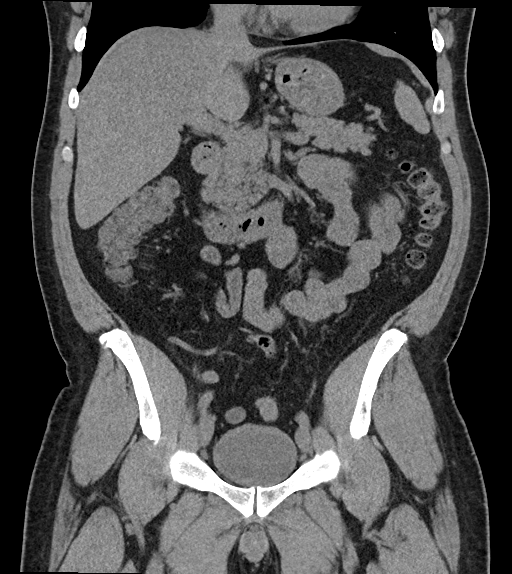
[im 59/107  soft-tissue]
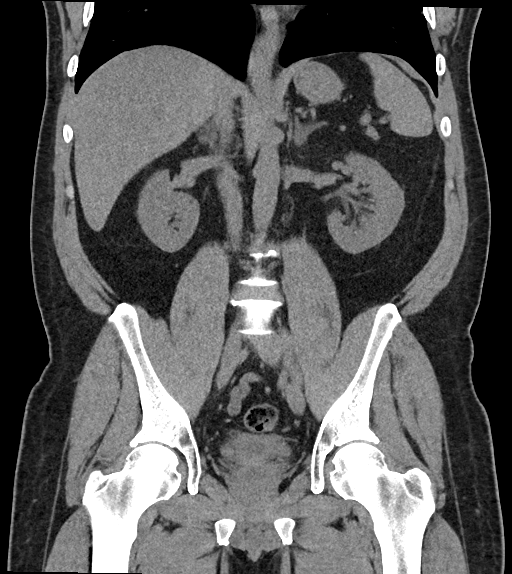

[17 of 46 positions shown; findings below may reference images not displayed]

FINDINGS: Lower chest: No acute abnormality.

Hepatobiliary: Fatty infiltration of the liver is noted. The
gallbladder is within normal limits.

Pancreas: Unremarkable. No pancreatic ductal dilatation or
surrounding inflammatory changes.

Spleen: Normal in size without focal abnormality.

Adrenals/Urinary Tract: Adrenal glands are unremarkable. Kidneys
demonstrate no renal calculi or obstructive changes. The ureters are
within normal limits bilaterally. The bladder is partially
distended.

Stomach/Bowel: No obstructive or inflammatory changes of the colon
are seen. The appendix is within normal limits. Small bowel and
stomach are unremarkable.

Vascular/Lymphatic: No significant vascular findings are present. No
enlarged abdominal or pelvic lymph nodes.

Reproductive: Prostate is unremarkable.

Other: No abdominal wall hernia or abnormality. No abdominopelvic
ascites.

Musculoskeletal: No acute or significant osseous findings.
IMPRESSION: No acute abnormality noted.

## 2021-04-08 ENCOUNTER — Other Ambulatory Visit (HOSPITAL_COMMUNITY): Payer: Self-pay

## 2021-04-08 ENCOUNTER — Other Ambulatory Visit: Payer: Self-pay

## 2021-04-08 ENCOUNTER — Encounter: Payer: Self-pay | Admitting: Student

## 2021-04-08 ENCOUNTER — Ambulatory Visit: Payer: 59 | Admitting: Student

## 2021-04-08 VITALS — BP 153/102 | HR 77 | Temp 98.5°F | Ht 72.0 in | Wt 282.6 lb

## 2021-04-08 DIAGNOSIS — E785 Hyperlipidemia, unspecified: Secondary | ICD-10-CM | POA: Diagnosis not present

## 2021-04-08 DIAGNOSIS — N529 Male erectile dysfunction, unspecified: Secondary | ICD-10-CM | POA: Diagnosis not present

## 2021-04-08 DIAGNOSIS — Z0000 Encounter for general adult medical examination without abnormal findings: Secondary | ICD-10-CM | POA: Diagnosis not present

## 2021-04-08 DIAGNOSIS — I10 Essential (primary) hypertension: Secondary | ICD-10-CM | POA: Diagnosis not present

## 2021-04-08 LAB — GLUCOSE, CAPILLARY: Glucose-Capillary: 107 mg/dL — ABNORMAL HIGH (ref 70–99)

## 2021-04-08 LAB — POCT GLYCOSYLATED HEMOGLOBIN (HGB A1C): Hemoglobin A1C: 6.2 % — AB (ref 4.0–5.6)

## 2021-04-08 MED ORDER — AMLODIPINE-OLMESARTAN 5-20 MG PO TABS
1.0000 | ORAL_TABLET | Freq: Every day | ORAL | 3 refills | Status: DC
  Filled 2021-04-08: qty 30, 30d supply, fill #0

## 2021-04-08 MED ORDER — AMLODIPINE BESYLATE-VALSARTAN 5-160 MG PO TABS
1.0000 | ORAL_TABLET | Freq: Every day | ORAL | 3 refills | Status: DC
  Filled 2021-04-08: qty 30, 30d supply, fill #0
  Filled 2021-05-07: qty 30, 30d supply, fill #1
  Filled 2021-06-06: qty 30, 30d supply, fill #2
  Filled 2021-07-07: qty 30, 30d supply, fill #3

## 2021-04-08 MED ORDER — SILDENAFIL CITRATE 25 MG PO TABS
25.0000 mg | ORAL_TABLET | Freq: Every day | ORAL | 0 refills | Status: AC | PRN
  Filled 2021-04-08: qty 20, 20d supply, fill #0

## 2021-04-08 NOTE — Patient Instructions (Signed)
Thank you, Mr.Keyler Merriott for allowing [place] to provide your care today. Today we discussed   High blood pressure We will be switching you over to a combination pill, amlodipine-olmesartan.  Please take 1 pill daily for 2 to 3 weeks.  At this time please increase to 2 pills daily.  If you find her self getting lightheaded or dizzy please call our clinic.  If you do of these episodes lightheadedness or dizziness please check your blood pressure at home and keep a recording of these.  Erectile difficulties We will be starting you on sildenafil, a medication to help with maintaining erection.  Please take 1 pill prior to sexual activity.  Please take on empty stomach.  If you have any side effects from this medication please give [place] a call.  If you feel as though you are maintaining erection for too long please go to the closest emergency department.  Do not take this medication with the nitroglycerin tablet replaced.    I have ordered the following labs for you:   Lab Orders     BMP8+Anion Gap     Lipid Profile   Referrals ordered today:   Referral Orders  No referral(s) requested today     I have ordered the following medication/changed the following medications:   Stop the following medications: There are no discontinued medications.   Start the following medications: No orders of the defined types were placed in this encounter.    Follow up: 2-3 months blood pressure recheck    Should you have any questions or concerns please call the internal medicine clinic at [phone].     Thalia Bloodgood, D.O. [facility]

## 2021-04-08 NOTE — Assessment & Plan Note (Addendum)
Assessment: Assessing patient for HLD and DM today with lipid panel and A1c.  Patient agrees to be screened for HIV as well as hepatitis C at next visit and will receive tetanus vaccine.  Patient requested tetanus vaccine today, however left before receiving.  Patient also endorses having colonoscopy the past 2 years.  Plan: -HLD, DM screening today -HIV, hepatitis C, tetanus that vaccine at next visit -Patient would not like to discuss COVID vaccines at this time   Addendum 04/09/2021 Patient's LDL resulted with total cholesterol of 199.  Considering the LDL cholesterol is 190 we will plan to start patient on statin therapy after assessing liver enzymes.  Hepatic function panel pending at this time.  Discussed this with the patient is willing to start statin therapy, will plan to start him on low-dose of high intensity statin if liver enzymes normal.  04/10/2021 Patient's liver enzymes unremarkable, will start low-dose of high intensity statin, rosuvastatin 20 daily.  Will call patient and inform him of this.

## 2021-04-08 NOTE — Assessment & Plan Note (Addendum)
BP Readings from Last 3 Encounters:  04/08/21 (!) 153/102  01/15/21 (!) 160/101  11/28/20 (!) 195/144    Assessment: Patient continues to have persistently elevated BP despite increasing amlodipine to 10 mg daily. Patient more willing to start additional BP medications since increasing amlodipine to 10 mg. Patient willing to start combination pill, will start arb-amlodipine combination. Patient to follow up in 2 months for BP recheck.  Patient also with abnormal creatinine at last visit, plan to recheck today.  Plan: -start amlodipine-valsartan 5-160 mg tab daily for 2-3 weeks, then take 2 tabs daily.  -patient instructed to watch out for lightheadedness/dizziness -instructed to take home bp readings and bring in cuff at next visit. -BMP pending  Addendum : Patient's insurance does not cover amlodipine-olmesartan, will switch to amlodipine valsartan.

## 2021-04-08 NOTE — Progress Notes (Signed)
   CC: hypertension, erectile dysfunction  HPI:  Sean Shah is a 50 y.o. male with a past medical history stated below and presents today for follow up managing his hypertension and with new concern for erectile dysfunction.. Please see problem based assessment and plan for additional details.  No past medical history on file.  Current Outpatient Medications on File Prior to Visit  Medication Sig Dispense Refill  . hydrocortisone (ANUSOL-HC) 25 MG suppository Place 1 suppository (25 mg total) rectally 2 (two) times daily. 12 suppository 0   No current facility-administered medications on file prior to visit.    Family History  Problem Relation Age of Onset  . Cancer Mother   . Vascular Disease Father     Social History   Socioeconomic History  . Marital status: Single    Spouse name: Not on file  . Number of children: Not on file  . Years of education: Not on file  . Highest education level: Not on file  Occupational History  . Not on file  Tobacco Use  . Smoking status: Never Smoker  . Smokeless tobacco: Never Used  Substance and Sexual Activity  . Alcohol use: Yes    Comment: rarely  . Drug use: Never  . Sexual activity: Yes  Other Topics Concern  . Not on file  Social History Narrative  . Not on file   Social Determinants of Health   Financial Resource Strain: Not on file  Food Insecurity: Not on file  Transportation Needs: Not on file  Physical Activity: Not on file  Stress: Not on file  Social Connections: Not on file  Intimate Partner Violence: Not on file    Review of Systems: ROS negative except for what is noted on the assessment and plan.  Vitals:   04/08/21 1011 04/08/21 1013 04/08/21 1123  BP: (!) 158/101 (!) 145/97 (!) 153/102  Pulse: 94 90 77  Temp: 98.5 F (36.9 C)    TempSrc: Oral    SpO2: 100%    Weight: 282 lb 9.6 oz (128.2 kg)    Height: 6' (1.829 m)       Physical Exam: Constitutional: well appearing male, in no acute  distress HENT: normocephalic atraumatic Eyes: conjunctiva non-erythematous Neck: supple Cardiovascular: regular rate and rhythm, no m/r/g Pulmonary/Chest: normal work of breathing on room air, lungs clear to auscultation bilaterally MSK: normal bulk and tone Neurological: alert & oriented x 3 Skin: warm and dry Psych: Normal mood and thought process   Assessment & Plan:   See Encounters Tab for problem based charting.  Patient discussed with Dr. Avie Echevaria, D.O. [facility] Internal Medicine, PGY-1 Pager: [phone], Phone: [phone] Date 04/08/2021 Time 2:31 PM

## 2021-04-08 NOTE — Assessment & Plan Note (Addendum)
Assessment: Patient states for the last few months difficulty with erectile dysfunction.  He notes that he has had less frequent morning erections.  He is also had difficulties maintaining erection while masturbating, however, he is still able to ejaculate.  He is also had recent intimate partner and when they began to partake in intercourse he was this is initial erection.  Patient denies ever having similar symptoms in the past.  I do believe that this is consistent with erectile dysfunction.  Low suspicion for hypogonadism or hypotestosteronism at this time.  Suspect vascular etiology at this time, with patient's uncontrolled hypertension.  We are also doing a lipid panel and A1c to assess for other risk factors for vascular disease.  We will start patient on sildenafil 25 mg daily.  Plan: -Start sildenafil 25 mg daily. -If patient starts developing signs of hypotestosteronism/hypogonadism, will evaluate further  Addendum Patient with hypercholesterolemia and suspect this along with hypertension are contributing to his secondary to vascular disease.  Statin therapy and continue to work to manage hypertension.

## 2021-04-09 LAB — LIPID PANEL
Chol/HDL Ratio: 5.8 ratio — ABNORMAL HIGH (ref 0.0–5.0)
Cholesterol, Total: 263 mg/dL — ABNORMAL HIGH (ref 100–199)
HDL: 45 mg/dL (ref >39)
LDL Chol Calc (NIH): 199 mg/dL — ABNORMAL HIGH (ref 0–99)
Triglycerides: 108 mg/dL (ref 0–149)
VLDL Cholesterol Cal: 19 mg/dL (ref 5–40)

## 2021-04-09 LAB — BMP8+ANION GAP
Anion Gap: 18 mmol/L (ref 10.0–18.0)
BUN/Creatinine Ratio: 7 — ABNORMAL LOW (ref 9–20)
BUN: 7 mg/dL (ref 6–24)
CO2: 21 mmol/L (ref 20–29)
Calcium: 9.4 mg/dL (ref 8.7–10.2)
Chloride: 101 mmol/L (ref 96–106)
Creatinine, Ser: 0.96 mg/dL (ref 0.76–1.27)
Glucose: 106 mg/dL — ABNORMAL HIGH (ref 65–99)
Potassium: 3.5 mmol/L (ref 3.5–5.2)
Sodium: 140 mmol/L (ref 134–144)
eGFR: 95 mL/min/{1.73_m2} (ref >59)

## 2021-04-09 NOTE — Progress Notes (Signed)
Internal Medicine Clinic Attending  Case discussed with Dr. Katsadouros  At the time of the visit.  We reviewed the resident's history and exam and pertinent patient test results.  I agree with the assessment, diagnosis, and plan of care documented in the resident's note.  

## 2021-04-09 NOTE — Addendum Note (Signed)
Addended by: Belva Agee on: 04/09/2021 03:50 PM   Modules accepted: Orders

## 2021-04-10 ENCOUNTER — Other Ambulatory Visit (HOSPITAL_COMMUNITY): Payer: Self-pay

## 2021-04-10 DIAGNOSIS — E785 Hyperlipidemia, unspecified: Secondary | ICD-10-CM | POA: Insufficient documentation

## 2021-04-10 LAB — HEPATIC FUNCTION PANEL
ALT: 19 IU/L (ref 0–44)
AST: 12 IU/L (ref 0–40)
Albumin: 4.4 g/dL (ref 3.8–4.9)
Alkaline Phosphatase: 101 IU/L (ref 44–121)
Bilirubin Total: 0.3 mg/dL (ref 0.0–1.2)
Bilirubin, Direct: <0.1 mg/dL (ref 0.00–0.40)
Total Protein: 7.3 g/dL (ref 6.0–8.5)

## 2021-04-10 LAB — SPECIMEN STATUS REPORT

## 2021-04-10 MED ORDER — ROSUVASTATIN CALCIUM 20 MG PO TABS
20.0000 mg | ORAL_TABLET | Freq: Every day | ORAL | 3 refills | Status: DC
  Filled 2021-04-10: qty 30, 30d supply, fill #0

## 2021-04-10 NOTE — Addendum Note (Signed)
Addended by: Belva Agee on: 04/10/2021 04:32 PM   Modules accepted: Orders

## 2021-04-10 NOTE — Assessment & Plan Note (Signed)
Assessment: Patient's LDL resulted with total cholesterol of 199.  Considering the LDL cholesterol is over 190 we will plan to start patient on statin therapy.  Patient's liver enzymes unremarkable.  We will start rosuvastatin 20 mg daily  Plan: -Start rosuvastatin 20 mg daily -Have patient return to clinic in 6 weeks for repeat lipid panel

## 2021-05-07 ENCOUNTER — Other Ambulatory Visit (HOSPITAL_COMMUNITY): Payer: Self-pay

## 2021-05-08 ENCOUNTER — Other Ambulatory Visit (HOSPITAL_COMMUNITY): Payer: Self-pay

## 2021-06-06 ENCOUNTER — Other Ambulatory Visit (HOSPITAL_COMMUNITY): Payer: Self-pay

## 2021-07-07 ENCOUNTER — Other Ambulatory Visit (HOSPITAL_COMMUNITY): Payer: Self-pay

## 2021-07-09 ENCOUNTER — Other Ambulatory Visit: Payer: Self-pay

## 2021-07-09 ENCOUNTER — Other Ambulatory Visit (HOSPITAL_COMMUNITY): Payer: Self-pay

## 2021-07-09 ENCOUNTER — Encounter: Payer: Self-pay | Admitting: Internal Medicine

## 2021-07-09 ENCOUNTER — Ambulatory Visit (INDEPENDENT_AMBULATORY_CARE_PROVIDER_SITE_OTHER): Payer: 59 | Admitting: Internal Medicine

## 2021-07-09 VITALS — BP 143/113 | HR 72 | Temp 98.2°F | Ht 72.0 in | Wt 268.2 lb

## 2021-07-09 DIAGNOSIS — I10 Essential (primary) hypertension: Secondary | ICD-10-CM

## 2021-07-09 DIAGNOSIS — E785 Hyperlipidemia, unspecified: Secondary | ICD-10-CM | POA: Diagnosis not present

## 2021-07-09 MED ORDER — AMLODIPINE BESYLATE-VALSARTAN 10-320 MG PO TABS
1.0000 | ORAL_TABLET | Freq: Every day | ORAL | 2 refills | Status: DC
  Filled 2021-07-09 – 2021-07-23 (×2): qty 30, 30d supply, fill #0
  Filled 2021-08-20: qty 30, 30d supply, fill #1
  Filled 2021-09-19: qty 30, 30d supply, fill #2

## 2021-07-09 NOTE — Progress Notes (Signed)
Internal Medicine Clinic Attending  I saw and evaluated the patient.  I personally confirmed the key portions of the history and exam documented by Dr.  Dean  and I reviewed pertinent patient test results.  The assessment, diagnosis, and plan were formulated together and I agree with the documentation in the resident's note.  

## 2021-07-09 NOTE — Assessment & Plan Note (Signed)
BP remains above goal at 170/108 with repeat checks of 161/104 and 143/113. He is currently taking amlodipine 5 mg-valsartan 160 mg and denies any weakness, syncope, headache.  - BMP checked today - Will increase to amlodipine 10 mg-valsartan 320 mg - RTC in 6-8 weeks for BP recheck

## 2021-07-09 NOTE — Patient Instructions (Addendum)
Thank you for visiting the Internal Medicine Clinic today. It was a pleasure to meet you! Today we discussed your high blood pressure and high cholesterol.  High blood pressure Your blood pressure remains high today despite taking the amlodipine-valsartan combination medication. I am going to increase the strength of your current medication to amlodipine 10 mg-valsartan 320 mg. I will send in a new prescription for this and you will take one tablet daily. In the meantime while you have pills at the current dose of amlodipine 5 mg-valsartan 160 mg, you can take two until you run out. I would like to follow-up in 6-8 weeks to check your blood pressure. I am going to check some blood work today to make sure your kidneys are tolerating the medication well and will let you know if there are any abnormal results.  High cholesterol I understand that you have not started the medication for your high cholesterol called rosuvastatin. Now that you are comfortable with your blood pressure medication I would like for you to start taking this. We will check your cholesterol levels to see how the medication has improved them in 6-8 weeks.  Pre-Diabetes I am glad to hear that you are working on your diet, increasing fruit and vegetable intake. Your weight is also down since your last visit in May. Keep up the good work!  Follow-up: In 6-8 weeks to recheck your blood pressure and cholesterol levels.  Remember: If you have any questions or concerns, please call our clinic at [phone] between 9am-5pm and after hours call [phone] and ask for the internal medicine resident on call. If you feel you are having a medical emergency please call 911.  Champ Mungo, DO

## 2021-07-09 NOTE — Assessment & Plan Note (Signed)
Patient has not initiated therapy with rosuvastatin 20 mg daily due to hesitancy of starting two new medications simultaneously. He is now comfortable with his blood pressure medication and willing to initiate rosuvastatin. He endorses lifestyle changes like increasing fruit and vegetable intake but has not been able to initiate regular exercise routine. - Follow-up in 6-8 weeks for recheck of lipid panel

## 2021-07-09 NOTE — Progress Notes (Signed)
   CC: hypertension, hyperlipidemia management  HPI:  [name] is a 50 y.o. male with past medical history of hypertension, hyperlipidemia, pre-diabetes, and erectile dysfunction who presents for follow-up of hypertension and hyperlipidemia management. Please see problem based charting for detailed assessment and plan.  Review of Systems:  Review of Systems  Constitutional:  Positive for weight loss.  Respiratory:  Negative for shortness of breath.   Cardiovascular:  Negative for chest pain and palpitations.  Gastrointestinal:  Negative for abdominal pain, constipation and diarrhea.  Genitourinary:  Negative for dysuria and urgency.  Musculoskeletal:  Negative for myalgias.  Neurological:  Negative for dizziness, loss of consciousness, weakness and headaches.    Physical Exam:  Vitals:   07/09/21 1004 07/09/21 1041 07/09/21 1057  BP: (!) 170/108 (!) 161/104 (!) 143/113  Pulse: 86 80 72  Temp: 98.2 F (36.8 C)    TempSrc: Oral    SpO2: 100%    Weight: 268 lb 3.2 oz (121.7 kg)    Height: 6' (1.829 m)     Physical Exam Vitals and nursing note reviewed.  Constitutional:      Appearance: Normal appearance.  Cardiovascular:     Rate and Rhythm: Normal rate and regular rhythm.     Pulses:          Radial pulses are 2+ on the right side and 2+ on the left side.       Posterior tibial pulses are 2+ on the right side and 2+ on the left side.     Heart sounds: Normal heart sounds.  Pulmonary:     Effort: Pulmonary effort is normal.     Breath sounds: Normal breath sounds and air entry.  Musculoskeletal:     Right lower leg: No edema.     Left lower leg: No edema.  Skin:    General: Skin is warm and dry.     Capillary Refill: Capillary refill takes less than 2 seconds.  Neurological:     General: No focal deficit present.     Mental Status: He is alert and oriented to person, place, and time.  Psychiatric:        Mood and Affect: Mood and affect normal.         Behavior: Behavior is cooperative.     Assessment & Plan:   See Encounters Tab for problem based charting.  Patient seen with Dr. Oswaldo Done

## 2021-07-10 LAB — BMP8+ANION GAP
Anion Gap: 22 mmol/L — ABNORMAL HIGH (ref 10.0–18.0)
BUN/Creatinine Ratio: 7 — ABNORMAL LOW (ref 9–20)
BUN: 7 mg/dL (ref 6–24)
CO2: 18 mmol/L — ABNORMAL LOW (ref 20–29)
Calcium: 9.3 mg/dL (ref 8.7–10.2)
Chloride: 100 mmol/L (ref 96–106)
Creatinine, Ser: 0.99 mg/dL (ref 0.76–1.27)
Glucose: 98 mg/dL (ref 65–99)
Potassium: 3.6 mmol/L (ref 3.5–5.2)
Sodium: 140 mmol/L (ref 134–144)
eGFR: 92 mL/min/{1.73_m2} (ref >59)

## 2021-07-23 ENCOUNTER — Other Ambulatory Visit (HOSPITAL_COMMUNITY): Payer: Self-pay

## 2021-08-20 ENCOUNTER — Other Ambulatory Visit (HOSPITAL_COMMUNITY): Payer: Self-pay

## 2021-08-20 ENCOUNTER — Encounter: Payer: Self-pay | Admitting: Internal Medicine

## 2021-08-20 ENCOUNTER — Ambulatory Visit: Payer: 59 | Admitting: Internal Medicine

## 2021-08-20 VITALS — BP 138/92 | HR 76 | Wt 273.0 lb

## 2021-08-20 DIAGNOSIS — I10 Essential (primary) hypertension: Secondary | ICD-10-CM | POA: Diagnosis not present

## 2021-08-20 DIAGNOSIS — E785 Hyperlipidemia, unspecified: Secondary | ICD-10-CM

## 2021-08-20 NOTE — Patient Instructions (Addendum)
Mr Bence Trapp,  It was a pleasure seeing you in clinic. Today we discussed:   Blood pressure: Please continue to take your blood pressure medication daily as prescribed. As discussed, recommend checking your blood pressure at home. Please keep a record of this and bring to your next appointment in 6 weeks.   Cholesterol:  I would recommend starting Rosuvastatin 20mg  daily at this time.   If you have any questions or concerns, please call our clinic at [phone] between 9am-5pm and after hours call [phone] and ask for the internal medicine resident on call. If you feel you are having a medical emergency please call 911.   Thank you, we look forward to helping you remain healthy!  If you have not gotten the COVID vaccine, I recommend doing so:  You may get it at your local [facility] or [facility] OR To schedule an appointment for a COVID vaccine or be added to the vaccine wait list: Go to [phone]   OR Go to [email or web address]                  OR Call [phone]                                     OR Call [phone] and select Option 2

## 2021-08-20 NOTE — Assessment & Plan Note (Signed)
BP Readings from Last 3 Encounters:  08/20/21 (!) 138/92  07/09/21 (!) 143/113  04/08/21 (!) 153/102   Blood pressure improved on current dosing of amlodipine 10mg -320mg  daily. Patient reports medication adherence and denies any side effects.  He denies any headaches, chest pain, vision changes, shortness of breath, focal deficits  Plan Continue amlodipine-valsartan 10-320 mg daily BMP today Patient advised to keep BP log at home Follow-up in 4 to 6 weeks for BP check and adjustment of medication

## 2021-08-20 NOTE — Assessment & Plan Note (Signed)
Patient with LDL > 190 on prior lipid panel was advised to initiate rosuvastatin 20 mg daily.  However, patient still has not been able to do so.  Patient counseled on importance of statin therapy for prevention of MI/stroke.  He expressed understanding and is agreeable to start statin therapy.  Plan Crestor 20 mg daily Repeat lipid panel at follow-up visit for goal 50% reduction of LDL

## 2021-08-20 NOTE — Progress Notes (Signed)
   CC: hypertension follow up  HPI:  [name] is a 50 y.o. PMHx of hypertension and hyperlipidemia presenting for follow up of his hypertension. No acute concerns at this time. Please see problem based charting for complete assessment and plan.  No past medical history on file. Review of Systems:  Negative except as stated in HPI.  Physical Exam:  Vitals:   08/20/21 0919  BP: (!) 138/92  Pulse: 76  SpO2: 100%  Weight: 273 lb (123.8 kg)   Physical Exam  Constitutional: Appears well-developed and well-nourished. No distress.  Cardiovascular: Normal rate, regular rhythm, S1 and S2 present, no murmurs, rubs, gallops.  Distal pulses intact Respiratory: No respiratory distress, no accessory muscle use.  Effort is normal.  Lungs are clear to auscultation bilaterally. Musculoskeletal: Normal bulk and tone.  No peripheral edema noted. Neurological: Is alert and oriented x4, no apparent focal deficits noted. Skin: Warm and dry.   Psychiatric: Normal mood and affect. Behavior is normal. Judgment and thought content normal.    Assessment & Plan:   See Encounters Tab for problem based charting.  Patient discussed with Dr. Heide Spark

## 2021-08-21 LAB — LIPID PANEL
Chol/HDL Ratio: 5.4 ratio — ABNORMAL HIGH (ref 0.0–5.0)
Cholesterol, Total: 239 mg/dL — ABNORMAL HIGH (ref 100–199)
HDL: 44 mg/dL (ref >39)
LDL Chol Calc (NIH): 173 mg/dL — ABNORMAL HIGH (ref 0–99)
Triglycerides: 123 mg/dL (ref 0–149)
VLDL Cholesterol Cal: 22 mg/dL (ref 5–40)

## 2021-08-21 LAB — BMP8+ANION GAP
Anion Gap: 14 mmol/L (ref 10.0–18.0)
BUN/Creatinine Ratio: 8 — ABNORMAL LOW (ref 9–20)
BUN: 9 mg/dL (ref 6–24)
CO2: 22 mmol/L (ref 20–29)
Calcium: 9 mg/dL (ref 8.7–10.2)
Chloride: 103 mmol/L (ref 96–106)
Creatinine, Ser: 1.07 mg/dL (ref 0.76–1.27)
Glucose: 108 mg/dL — ABNORMAL HIGH (ref 70–99)
Potassium: 3.8 mmol/L (ref 3.5–5.2)
Sodium: 139 mmol/L (ref 134–144)
eGFR: 83 mL/min/{1.73_m2} (ref >59)

## 2021-08-21 NOTE — Progress Notes (Signed)
Internal Medicine Clinic Attending ° °Case discussed with Dr. Aslam  At the time of the visit.  We reviewed the resident’s history and exam and pertinent patient test results.  I agree with the assessment, diagnosis, and plan of care documented in the resident’s note.  °

## 2021-08-25 NOTE — Addendum Note (Signed)
Addended by: Eliezer Bottom on: 08/25/2021 09:39 AM   Modules accepted: Level of Service

## 2021-09-19 ENCOUNTER — Other Ambulatory Visit (HOSPITAL_COMMUNITY): Payer: Self-pay

## 2021-10-01 ENCOUNTER — Other Ambulatory Visit (HOSPITAL_COMMUNITY): Payer: Self-pay

## 2021-10-01 ENCOUNTER — Ambulatory Visit (INDEPENDENT_AMBULATORY_CARE_PROVIDER_SITE_OTHER): Payer: 59 | Admitting: Internal Medicine

## 2021-10-01 DIAGNOSIS — E785 Hyperlipidemia, unspecified: Secondary | ICD-10-CM | POA: Diagnosis not present

## 2021-10-01 DIAGNOSIS — R0989 Other specified symptoms and signs involving the circulatory and respiratory systems: Secondary | ICD-10-CM | POA: Insufficient documentation

## 2021-10-01 DIAGNOSIS — I10 Essential (primary) hypertension: Secondary | ICD-10-CM | POA: Diagnosis not present

## 2021-10-01 DIAGNOSIS — E119 Type 2 diabetes mellitus without complications: Secondary | ICD-10-CM | POA: Insufficient documentation

## 2021-10-01 DIAGNOSIS — R7303 Prediabetes: Secondary | ICD-10-CM | POA: Diagnosis not present

## 2021-10-01 MED ORDER — AMLODIPINE BESYLATE-VALSARTAN 10-320 MG PO TABS
1.0000 | ORAL_TABLET | Freq: Every day | ORAL | 2 refills | Status: DC
  Filled 2021-10-01: qty 90, 90d supply, fill #0
  Filled 2021-10-21: qty 30, 30d supply, fill #0
  Filled 2021-11-20: qty 30, 30d supply, fill #1
  Filled 2021-12-19: qty 30, 30d supply, fill #2
  Filled 2022-01-20: qty 30, 30d supply, fill #3
  Filled 2022-02-19: qty 30, 30d supply, fill #4
  Filled 2022-03-18: qty 30, 30d supply, fill #5
  Filled 2022-04-17: qty 30, 30d supply, fill #6
  Filled 2022-05-19: qty 30, 30d supply, fill #7
  Filled 2022-06-19: qty 30, 30d supply, fill #8

## 2021-10-01 NOTE — Assessment & Plan Note (Signed)
A1c 6 mo ago 6.2. Not on metformin. Will repeat A1c at f/u office visit.  A1c at next office visit Will consider starting metformin if appropriate.

## 2021-10-01 NOTE — Progress Notes (Signed)
   CC: 6 week office follow up visit turned into telephone visit given pt complaining of nasal congestion and sore throat this AM.   This is a telephone encounter between [employer] and [employer] on 10/01/2021 for [employer]. The visit was conducted with the patient located at home and [facility] at [facility]. The patient's identity was confirmed using their DOB and current address. The patient has consented to being evaluated through a telephone encounter and understands the associated risks (an examination cannot be done and the patient may need to come in for an appointment) / benefits (allows the patient to remain at home, decreasing exposure to coronavirus). I personally spent 10 minutes on medical discussion.   HPI:  Mr.Javin Snedden is a 50 y.o. with pertinent PMHx of HTN HLD and pre-diabetes.   Please see A&P for assessment of the patient's acute and chronic medical conditions.   Review of Systems:   ROS negative except for what is noted on the assessment and plan.   Assessment & Plan:   See Encounters Tab for problem based charting.  Patient discussed with Dr. Rosemarie Ax, MD Internal Medicine Resident, PGY-1 Internal Medicine Clinic

## 2021-10-01 NOTE — Assessment & Plan Note (Signed)
Tele-health encounter on 10/23. Pt reports taking his BP medications daily without any adverse effects. Denies SHOB,CP, and headaches.   Continue current treatment (prescription refilled).

## 2021-10-01 NOTE — Assessment & Plan Note (Signed)
Tele-health encounter on 11/23. Pt reports that he has not yet started taking Crestor 20. Reports that he "just forgot". Encouraged him to please start taking it today. Will follow up on this during f/u office visit once his suspected viral infection resolves.   Continue Crestor 20 mg qd

## 2021-10-01 NOTE — Assessment & Plan Note (Addendum)
Complaints of cough and nasal congestion since this AM. Has not tried OTC medications yet. Not vaccinated against COVID or Flu. Advised to use OTC medications for cough and cold. Denies r myalgia, headache, fevers, chills, N/V, diarhea, constipation, abdominal pain, SHOB, chest pain, or leg swelling. No recent sick contact. Constellation of sxs consistent with viral infection.  - OTC medications for symptomatic relief  - Advised to get tested for COVID; will likely not require tx as his sxs are very mild. - Advised to CTM sxs and go to UC or ED if pt notices worsening of symptoms

## 2021-10-01 NOTE — Patient Instructions (Signed)
Thank you, Sean Shah for allowing [place] to provide your care today!  Today we discussed:  High blood pressure: Your prescription was refilled   Cholesterol: Please start taking Crestor  Cold: Start taking cold and flu medications over the counter.    Medications ordered   Start the following medications: Meds ordered this encounter  Medications   amLODipine-valsartan (EXFORGE) 10-320 MG tablet    Sig: Take 1 tablet by mouth daily.    Dispense:  90 tablet    Refill:  2     Follow up in: 1 week    Should you have any questions or concerns please call the internal medicine clinic at [phone].     Carmel Sacramento, MD  Internal Medicine Resident, PGY-1 [facility]

## 2021-10-03 NOTE — Progress Notes (Signed)
Internal Medicine Clinic Attending  Case discussed with Dr. Patel  At the time of the visit.  We reviewed the resident's history and exam and pertinent patient test results.  I agree with the assessment, diagnosis, and plan of care documented in the resident's note.  

## 2021-10-21 ENCOUNTER — Other Ambulatory Visit (HOSPITAL_COMMUNITY): Payer: Self-pay

## 2021-11-20 ENCOUNTER — Other Ambulatory Visit (HOSPITAL_COMMUNITY): Payer: Self-pay

## 2021-12-19 ENCOUNTER — Other Ambulatory Visit (HOSPITAL_COMMUNITY): Payer: Self-pay

## 2022-01-20 ENCOUNTER — Other Ambulatory Visit (HOSPITAL_COMMUNITY): Payer: Self-pay

## 2022-02-19 ENCOUNTER — Other Ambulatory Visit (HOSPITAL_COMMUNITY): Payer: Self-pay

## 2022-03-18 ENCOUNTER — Other Ambulatory Visit (HOSPITAL_COMMUNITY): Payer: Self-pay

## 2022-04-17 ENCOUNTER — Other Ambulatory Visit (HOSPITAL_COMMUNITY): Payer: Self-pay

## 2022-05-19 ENCOUNTER — Other Ambulatory Visit (HOSPITAL_COMMUNITY): Payer: Self-pay

## 2022-06-19 ENCOUNTER — Other Ambulatory Visit (HOSPITAL_COMMUNITY): Payer: Self-pay

## 2022-07-14 ENCOUNTER — Other Ambulatory Visit: Payer: Self-pay

## 2022-07-14 ENCOUNTER — Other Ambulatory Visit: Payer: Self-pay | Admitting: Student

## 2022-07-14 ENCOUNTER — Other Ambulatory Visit (HOSPITAL_COMMUNITY): Payer: Self-pay

## 2022-07-14 MED ORDER — AMLODIPINE BESYLATE-VALSARTAN 10-320 MG PO TABS
1.0000 | ORAL_TABLET | Freq: Every day | ORAL | 2 refills | Status: DC
  Filled 2022-07-14: qty 30, 30d supply, fill #0

## 2022-07-14 NOTE — Telephone Encounter (Signed)
Refill Request- pt has only 5 pills left. amLODipine-valsartan (EXFORGE) 10-320 MG tablet  [facility]

## 2022-07-22 ENCOUNTER — Encounter: Payer: 59 | Admitting: Student

## 2022-07-30 ENCOUNTER — Other Ambulatory Visit (HOSPITAL_COMMUNITY): Payer: Self-pay

## 2022-07-30 ENCOUNTER — Encounter: Payer: Self-pay | Admitting: Student

## 2022-07-30 ENCOUNTER — Other Ambulatory Visit: Payer: Self-pay

## 2022-07-30 ENCOUNTER — Ambulatory Visit: Payer: 59 | Admitting: Student

## 2022-07-30 VITALS — BP 132/89 | HR 72 | Temp 97.8°F | Ht 72.0 in | Wt 271.2 lb

## 2022-07-30 DIAGNOSIS — R7303 Prediabetes: Secondary | ICD-10-CM

## 2022-07-30 DIAGNOSIS — I10 Essential (primary) hypertension: Secondary | ICD-10-CM

## 2022-07-30 DIAGNOSIS — Z23 Encounter for immunization: Secondary | ICD-10-CM | POA: Diagnosis not present

## 2022-07-30 DIAGNOSIS — E785 Hyperlipidemia, unspecified: Secondary | ICD-10-CM

## 2022-07-30 DIAGNOSIS — Z0000 Encounter for general adult medical examination without abnormal findings: Secondary | ICD-10-CM

## 2022-07-30 LAB — POCT GLYCOSYLATED HEMOGLOBIN (HGB A1C): Hemoglobin A1C: 6.1 % — AB (ref 4.0–5.6)

## 2022-07-30 LAB — GLUCOSE, CAPILLARY: Glucose-Capillary: 124 mg/dL — ABNORMAL HIGH (ref 70–99)

## 2022-07-30 MED ORDER — ROSUVASTATIN CALCIUM 20 MG PO TABS
20.0000 mg | ORAL_TABLET | Freq: Every day | ORAL | 3 refills | Status: DC
  Filled 2022-07-30: qty 30, 30d supply, fill #0

## 2022-07-30 MED ORDER — AMLODIPINE BESYLATE-VALSARTAN 10-320 MG PO TABS
1.0000 | ORAL_TABLET | Freq: Every day | ORAL | 2 refills | Status: DC
  Filled 2022-07-30: qty 90, 90d supply, fill #0
  Filled 2022-08-19: qty 30, 30d supply, fill #0
  Filled 2022-09-17: qty 30, 30d supply, fill #1
  Filled 2022-10-19: qty 30, 30d supply, fill #2
  Filled 2022-11-18: qty 30, 30d supply, fill #3
  Filled 2022-12-17: qty 30, 30d supply, fill #4
  Filled 2023-01-15: qty 30, 30d supply, fill #5
  Filled 2023-02-15: qty 30, 30d supply, fill #6
  Filled 2023-03-22: qty 30, 30d supply, fill #7
  Filled 2023-04-19: qty 30, 30d supply, fill #8

## 2022-07-30 NOTE — Progress Notes (Signed)
Subjective:  Reason for visit: Follow up for ASCVD primary prevention  HPI:  Mr. Sean Shah is a 51 y.o. male with history of hypertension, hyperlipidemia, prediabetes, and erectile dysfunction who presents for routine follow-up and counseling regarding ASCVD primary prevention. Please see problem based assessment and plan for additional details.  No past medical history on file.  Current Outpatient Medications on File Prior to Visit  Medication Sig Dispense Refill   sildenafil (VIAGRA) 25 MG tablet Take 1 tablet (25 mg total) by mouth daily as needed for erectile dysfunction. 20 tablet 0   No current facility-administered medications on file prior to visit.    Family History  Problem Relation Age of Onset   Cancer Mother    Vascular Disease Father    Early death Neg Hx     Social History   Socioeconomic History   Marital status: Single    Spouse name: Not on file   Number of children: Not on file   Years of education: Not on file   Highest education level: Not on file  Occupational History   Not on file  Tobacco Use   Smoking status: Never   Smokeless tobacco: Never  Substance and Sexual Activity   Alcohol use: Yes    Comment: rarely   Drug use: Never   Sexual activity: Yes  Other Topics Concern   Not on file  Social History Narrative   Not on file   Social Determinants of Health   Financial Resource Strain: Not on file  Food Insecurity: Not on file  Transportation Needs: Not on file  Physical Activity: Not on file  Stress: Not on file  Social Connections: Not on file  Intimate Partner Violence: Not on file    Review of Systems: ROS negative except for what is noted on the assessment and plan.  Objective:   Vitals:   07/30/22 0911  BP: 132/89  Pulse: 72  Temp: 97.8 F (36.6 C)  TempSrc: Oral  SpO2: 100%  Weight: 271 lb 3.2 oz (123 kg)  Height: 6' (1.829 m)    Physical Exam Constitutional:      General: He is not in acute  distress.    Appearance: Normal appearance.  HENT:     Mouth/Throat:     Mouth: Mucous membranes are moist.  Cardiovascular:     Rate and Rhythm: Normal rate and regular rhythm.     Pulses: Normal pulses.  Pulmonary:     Effort: Pulmonary effort is normal.     Breath sounds: Normal breath sounds. No stridor.  Abdominal:     Palpations: Abdomen is soft.     Tenderness: There is no abdominal tenderness.  Musculoskeletal:     Right lower leg: No edema.     Left lower leg: No edema.  Lymphadenopathy:     Cervical: No cervical adenopathy.  Skin:    General: Skin is warm and dry.  Neurological:     Mental Status: He is alert. Mental status is at baseline.  Psychiatric:        Mood and Affect: Mood normal.        Behavior: Behavior normal.       Assessment & Plan:  Hyperlipidemia Last lipid panel on 08/20/2021 showed TC 239, HDL 44, LDL 173, TG 123.  Not adherent to rosuvastatin.  Repeat lipid panel today.  Recommend restarting rosuvastatin to prevent stroke and heart attack. - Restart rosuvastatin 20 mg daily  Hypertension Clinic blood pressure 132/89, repeated  once.  Doing well on amlodipine-valsartan 10-320.  Stage I hypertension on 2 agents.  Did not make any changes today.  BMP today - Continue amlodipine-valsartan 10-320 mg daily.  Healthcare maintenance - Colonoscopy 07/09/2021.  Needs more frequent surveillance due to family history of colon cancer. - Declined flu vaccine. - Tdap administered today. - HIV and HCV screening completed today.  Prediabetes History of prediabetes with hemoglobin A1c 6.2 in May 2022.  Repeat today was 6.1. - Repeat hemoglobin A1c in a year.   Return in about 6 months (around 01/28/2023) for hypercholesterolemia and ASCVD primary prevention.  Patient seen with Dr. Luna Kitchens, M.D. [facility]  PGY-1 Pager: [phone] Date 07/30/2022  Time 10:40 AM

## 2022-07-30 NOTE — Assessment & Plan Note (Signed)
-   Colonoscopy 07/09/2021.  Needs more frequent surveillance due to family history of colon cancer. - Declined flu vaccine. - Tdap administered today. - HIV and HCV screening completed today.

## 2022-07-30 NOTE — Progress Notes (Signed)
Internal Medicine Clinic Attending  I saw and evaluated the patient.  I personally confirmed the key portions of the history and exam documented by Dr. McLendon and I reviewed pertinent patient test results.  The assessment, diagnosis, and plan were formulated together and I agree with the documentation in the resident's note.  

## 2022-07-30 NOTE — Assessment & Plan Note (Signed)
Clinic blood pressure 132/89, repeated once.  Doing well on amlodipine-valsartan 10-320.  Stage I hypertension on 2 agents.  Did not make any changes today.  BMP today - Continue amlodipine-valsartan 10-320 mg daily.

## 2022-07-30 NOTE — Assessment & Plan Note (Signed)
Last lipid panel on 08/20/2021 showed TC 239, HDL 44, LDL 173, TG 123.  Not adherent to rosuvastatin.  Repeat lipid panel today.  Recommend restarting rosuvastatin to prevent stroke and heart attack. - Restart rosuvastatin 20 mg daily

## 2022-07-30 NOTE — Patient Instructions (Signed)
Today we discussed health promotion.  For your cholesterol, and to prevent stroke and heart attack, restart rosuvastatin (Crestor) 1 tablet per day.  Return to the clinic in 6 months - 1 year for a follow-up visit.    I will call you with the results of the following laboratory tests:   Lab Orders         BMP8+Anion Gap         HIV antibody (with reflex)         Hepatitis C Ab reflex to Quant PCR         POC Hbg A1C      Please call our clinic at [phone] Monday through Friday from 9 am to 4 pm if you have questions or concerns about your health. If after hours or on the weekend, call the main hospital number and ask for the Internal Medicine Resident On-Call. If you need medication refills, please notify your pharmacy one week in advance and they will send [place] a request.   Best, Nani Gasser, [place]

## 2022-07-30 NOTE — Assessment & Plan Note (Signed)
History of prediabetes with hemoglobin A1c 6.2 in May 2022.  Repeat today was 6.1. - Repeat hemoglobin A1c in a year.

## 2022-08-01 LAB — BMP8+ANION GAP
Anion Gap: 17 mmol/L (ref 10.0–18.0)
BUN/Creatinine Ratio: 7 — ABNORMAL LOW (ref 9–20)
BUN: 7 mg/dL (ref 6–24)
CO2: 19 mmol/L — ABNORMAL LOW (ref 20–29)
Calcium: 9.1 mg/dL (ref 8.7–10.2)
Chloride: 102 mmol/L (ref 96–106)
Creatinine, Ser: 0.97 mg/dL (ref 0.76–1.27)
Glucose: 114 mg/dL — ABNORMAL HIGH (ref 70–99)
Potassium: 3.6 mmol/L (ref 3.5–5.2)
Sodium: 138 mmol/L (ref 134–144)
eGFR: 93 mL/min/{1.73_m2} (ref >59)

## 2022-08-01 LAB — HCV AB W REFLEX TO QUANT PCR: HCV Ab: NONREACTIVE

## 2022-08-01 LAB — HCV INTERPRETATION

## 2022-08-01 LAB — HIV ANTIBODY (ROUTINE TESTING W REFLEX): HIV Screen 4th Generation wRfx: NONREACTIVE

## 2022-08-11 ENCOUNTER — Other Ambulatory Visit (HOSPITAL_COMMUNITY): Payer: Self-pay

## 2022-08-19 ENCOUNTER — Other Ambulatory Visit (HOSPITAL_COMMUNITY): Payer: Self-pay

## 2022-09-17 ENCOUNTER — Other Ambulatory Visit (HOSPITAL_COMMUNITY): Payer: Self-pay

## 2022-10-19 ENCOUNTER — Other Ambulatory Visit (HOSPITAL_COMMUNITY): Payer: Self-pay

## 2022-11-18 ENCOUNTER — Other Ambulatory Visit (HOSPITAL_COMMUNITY): Payer: Self-pay

## 2022-11-19 ENCOUNTER — Other Ambulatory Visit (HOSPITAL_COMMUNITY): Payer: Self-pay

## 2023-04-05 ENCOUNTER — Encounter: Payer: Self-pay | Admitting: *Deleted

## 2023-05-18 ENCOUNTER — Other Ambulatory Visit: Payer: Self-pay

## 2023-05-18 ENCOUNTER — Other Ambulatory Visit (HOSPITAL_COMMUNITY): Payer: Self-pay

## 2023-05-18 DIAGNOSIS — I10 Essential (primary) hypertension: Secondary | ICD-10-CM

## 2023-05-18 MED ORDER — AMLODIPINE BESYLATE-VALSARTAN 10-320 MG PO TABS
1.0000 | ORAL_TABLET | Freq: Every day | ORAL | 2 refills | Status: DC
Start: 2023-05-18 — End: 2023-12-21
  Filled 2023-05-18: qty 90, 90d supply, fill #0
  Filled 2023-08-24: qty 90, 90d supply, fill #1
  Filled 2023-11-24: qty 90, 90d supply, fill #2

## 2023-05-19 ENCOUNTER — Other Ambulatory Visit (HOSPITAL_COMMUNITY): Payer: Self-pay

## 2023-05-26 ENCOUNTER — Ambulatory Visit (INDEPENDENT_AMBULATORY_CARE_PROVIDER_SITE_OTHER): Payer: No Typology Code available for payment source | Admitting: Student

## 2023-05-26 ENCOUNTER — Other Ambulatory Visit (HOSPITAL_COMMUNITY): Payer: Self-pay

## 2023-05-26 ENCOUNTER — Encounter: Payer: Self-pay | Admitting: Student

## 2023-05-26 VITALS — BP 139/91 | HR 87 | Temp 98.5°F | Ht 72.0 in | Wt 300.0 lb

## 2023-05-26 DIAGNOSIS — I10 Essential (primary) hypertension: Secondary | ICD-10-CM

## 2023-05-26 DIAGNOSIS — Z6841 Body Mass Index (BMI) 40.0 and over, adult: Secondary | ICD-10-CM

## 2023-05-26 DIAGNOSIS — B351 Tinea unguium: Secondary | ICD-10-CM | POA: Insufficient documentation

## 2023-05-26 DIAGNOSIS — B070 Plantar wart: Secondary | ICD-10-CM

## 2023-05-26 DIAGNOSIS — R7303 Prediabetes: Secondary | ICD-10-CM | POA: Diagnosis not present

## 2023-05-26 DIAGNOSIS — E785 Hyperlipidemia, unspecified: Secondary | ICD-10-CM | POA: Diagnosis not present

## 2023-05-26 MED ORDER — ROSUVASTATIN CALCIUM 20 MG PO TABS
20.0000 mg | ORAL_TABLET | Freq: Every day | ORAL | 3 refills | Status: AC
Start: 2023-05-26 — End: ?
  Filled 2023-05-26: qty 30, 30d supply, fill #0

## 2023-05-26 NOTE — Assessment & Plan Note (Signed)
Patient has 2 lesions on the bottom of the right foot.  They are nonpainful, erythematous, or fluctuant.  Favored to reflect plantar wart. Plan: Podiatry referral.

## 2023-05-26 NOTE — Progress Notes (Signed)
Subjective:  CC: Routine follow-up  HPI:  Sean Shah is a 52 y.o. male with a past medical history stated below and presents today for routine follow-up. Please see problem based assessment and plan for additional details.  No past medical history on file.  Current Outpatient Medications on File Prior to Visit  Medication Sig Dispense Refill   amLODipine-valsartan (EXFORGE) 10-320 MG tablet Take 1 tablet by mouth daily. 90 tablet 2   sildenafil (VIAGRA) 25 MG tablet Take 1 tablet (25 mg total) by mouth daily as needed for erectile dysfunction. 20 tablet 0   No current facility-administered medications on file prior to visit.    Family History  Problem Relation Age of Onset   Cancer Mother    Vascular Disease Father    Early death Neg Hx     Social History   Socioeconomic History   Marital status: Single    Spouse name: Not on file   Number of children: Not on file   Years of education: Not on file   Highest education level: Not on file  Occupational History   Not on file  Tobacco Use   Smoking status: Never   Smokeless tobacco: Never  Substance and Sexual Activity   Alcohol use: Yes    Comment: rarely   Drug use: Never   Sexual activity: Yes  Other Topics Concern   Not on file  Social History Narrative   Not on file   Social Determinants of Health   Financial Resource Strain: Not on file  Food Insecurity: Not on file  Transportation Needs: Not on file  Physical Activity: Not on file  Stress: Not on file  Social Connections: Not on file  Intimate Partner Violence: Not on file    Review of Systems: ROS negative except for what is noted on the assessment and plan.  Objective:   Vitals:   05/26/23 1536 05/26/23 1559  BP: (!) 151/94 (!) 139/91  Pulse: 95 87  Temp: 98.5 F (36.9 C)   TempSrc: Oral   SpO2: 99%   Weight: 300 lb (136.1 kg)   Height: 6' (1.829 m)     Physical Exam: Constitutional: well-appearing in no acute  distress HENT: normocephalic atraumatic, mucous membranes moist Eyes: conjunctiva non-erythematous Neck: supple Cardiovascular: regular rate and rhythm, no m/r/g Pulmonary/Chest: normal work of breathing on room air, lungs clear to auscultation bilaterally Abdominal: soft, non-tender, non-distended MSK: normal bulk and tone Skin: warm and dry.  Otomycosis of the toenails, 2 lesions of the right foot favored to reflect plantar warts. Psych: Normal mood and affect   Assessment & Plan:  Hypertension Patient hypertensive today to 139/91.  Current medication regiment includes amlodipine valsartan 08/12/2019.  Patient reports being compliant with this medication and taking it today.  Patient hesitant to start new medications  Plan: - Follow-up 2 months blood pressure check -Discussed weight loss, diet and exercise in order to send conservative management of hypertension. -CMP  Plantar warts Patient has 2 lesions on the bottom of the right foot.  They are nonpainful, erythematous, or fluctuant.  Favored to reflect plantar wart. Plan: Podiatry referral.  Onychomycosis Onychomycoses of the toes present bilaterally. Plan: Podiatry referral.  Prediabetes Hemoglobin A1c found to be 6.2% on 04/08/2021, repeat A1c on 07/30/2022 showed A1c 6.1%.  Discussed with the patient that he is prediabetic.   Plan: -Discussed exercise and dietary management of blood sugar with the patient. --Encouraged the patient to quit drinking soda and liquid calories as  well as sweets such as cakes which the patient endorsed eating. -Repeat a HbA1c today.   Morbid obesity with body mass index (BMI) of 40.0 or higher (HCC) Patient BMI today greater than 40, with concomitant hypertension hyperlipidemia prediabetes.  30 pound weight gain since last visit in September of last year.  This was shocking to the patient.  He attributes this to increased sedentary job.  Plan: -- HbA1c, CMP, lipid panel to better assess  glycemic control, renal and hepatic status (assess for signs of NASH), cholesterol levels. --Discussed with patient diet and exercise measures to lose weight -Follow-up 2 months to assess weight loss   Hyperlipidemia Patient's lipid panel dated 08/20/2021 demonstrated hypercholesterolemia, with elevated LDL to 173.  Patient was supposed to start rosuvastatin, but never did.  Patient's ASCVD score today calculated to be 13.3%  Plan: Restart rosuvastatin 20 Lipid panel today Follow-up 2 months.   Patient seen with Dr. Heide Scales MD [facility]  PGY-1 Pager: [phone]  Phone: [phone] Date 05/26/2023  Time 6:27 PM

## 2023-05-26 NOTE — Assessment & Plan Note (Addendum)
Hemoglobin A1c found to be 6.2% on 04/08/2021, repeat A1c on 07/30/2022 showed A1c 6.1%.  Discussed with the patient that he is prediabetic.   Plan: -Discussed exercise and dietary management of blood sugar with the patient. --Encouraged the patient to quit drinking soda and liquid calories as well as sweets such as cakes which the patient endorsed eating. -Repeat a HbA1c today.

## 2023-05-26 NOTE — Assessment & Plan Note (Signed)
Patient BMI today greater than 40, with concomitant hypertension hyperlipidemia prediabetes.  30 pound weight gain since last visit in September of last year.  This was shocking to the patient.  He attributes this to increased sedentary job.  Plan: -- HbA1c, CMP, lipid panel to better assess glycemic control, renal and hepatic status (assess for signs of NASH), cholesterol levels. --Discussed with patient diet and exercise measures to lose weight -Follow-up 2 months to assess weight loss

## 2023-05-26 NOTE — Patient Instructions (Addendum)
Thank you, Mr.Kobie Antolin for allowing [place] to provide your care today. Today we discussed routine follow up, blood pressure, cholesterol, blood sugar.    I have ordered the following labs for you:  Lab Orders         CMP14 + Anion Gap         Lipid Profile         Hemoglobin A1c      Referrals ordered today:   Referral Orders         Ambulatory referral to Podiatry       I have ordered the following medication/changed the following medications:   Stop the following medications: Medications Discontinued During This Encounter  Medication Reason   rosuvastatin (CRESTOR) 20 MG tablet Reorder     Start the following medications: Meds ordered this encounter  Medications   rosuvastatin (CRESTOR) 20 MG tablet    Sig: Take 1 tablet (20 mg total) by mouth daily.    Dispense:  30 tablet    Refill:  3     Follow up: 2 months   We look forward to seeing you next time. Please call our clinic at [phone] if you have any questions or concerns. The best time to call is Monday-Friday from 9am-4pm, but there is someone available 24/7. If after hours or the weekend, call the main hospital number and ask for the Internal Medicine Resident On-Call. If you need medication refills, please notify your pharmacy one week in advance and they will send [place] a request.   Thank you for trusting me with your care. Wishing you the best!  Lovie Macadamia MD [facility]

## 2023-05-26 NOTE — Assessment & Plan Note (Addendum)
Patient hypertensive today to 139/91.  Current medication regiment includes amlodipine valsartan.  Patient reports being compliant with this medication and taking it today.  Patient hesitant to start new medications  Plan: - Follow-up 2 months blood pressure check -Discussed weight loss, diet and exercise in order to send conservative management of hypertension. -CMP

## 2023-05-26 NOTE — Assessment & Plan Note (Signed)
Patient's lipid panel dated 08/20/2021 demonstrated hypercholesterolemia, with elevated LDL to 173.  Patient was supposed to start rosuvastatin, but never did.  Patient's ASCVD score today calculated to be 13.3%  Plan: Restart rosuvastatin 20 Lipid panel today Follow-up 2 months.

## 2023-05-26 NOTE — Assessment & Plan Note (Addendum)
Onychomycoses of the toes present Plan: Podiatry referral.

## 2023-05-27 NOTE — Progress Notes (Signed)
Internal Medicine Clinic Attending  I was physically present during the key portions of the resident provided service and participated in the medical decision making of patient's management care. I reviewed pertinent patient test results.  The assessment, diagnosis, and plan were formulated together and I agree with the documentation in the resident's note.  Narendra, Nischal, MD  

## 2023-05-28 ENCOUNTER — Telehealth: Payer: Self-pay | Admitting: Student

## 2023-05-28 LAB — CMP14 + ANION GAP
ALT: 19 IU/L (ref 0–44)
AST: 18 IU/L (ref 0–40)
Albumin: 4.4 g/dL (ref 3.8–4.9)
Alkaline Phosphatase: 92 IU/L (ref 44–121)
Anion Gap: 18 mmol/L (ref 10.0–18.0)
BUN/Creatinine Ratio: 7 — ABNORMAL LOW (ref 9–20)
BUN: 8 mg/dL (ref 6–24)
Bilirubin Total: 0.3 mg/dL (ref 0.0–1.2)
CO2: 21 mmol/L (ref 20–29)
Calcium: 9.3 mg/dL (ref 8.7–10.2)
Chloride: 101 mmol/L (ref 96–106)
Creatinine, Ser: 1.1 mg/dL (ref 0.76–1.27)
Globulin, Total: 2.7 g/dL (ref 1.5–4.5)
Glucose: 95 mg/dL (ref 70–99)
Potassium: 3.7 mmol/L (ref 3.5–5.2)
Sodium: 140 mmol/L (ref 134–144)
Total Protein: 7.1 g/dL (ref 6.0–8.5)
eGFR: 80 mL/min/{1.73_m2} (ref >59)

## 2023-05-28 LAB — HEMOGLOBIN A1C
Est. average glucose Bld gHb Est-mCnc: 143 mg/dL
Hgb A1c MFr Bld: 6.6 % — ABNORMAL HIGH (ref 4.8–5.6)

## 2023-05-28 LAB — LIPID PANEL
Chol/HDL Ratio: 6.3 ratio — ABNORMAL HIGH (ref 0.0–5.0)
Cholesterol, Total: 253 mg/dL — ABNORMAL HIGH (ref 100–199)
HDL: 40 mg/dL (ref >39)
LDL Chol Calc (NIH): 178 mg/dL — ABNORMAL HIGH (ref 0–99)
Triglycerides: 188 mg/dL — ABNORMAL HIGH (ref 0–149)
VLDL Cholesterol Cal: 35 mg/dL (ref 5–40)

## 2023-05-28 NOTE — Telephone Encounter (Signed)
I spoke with Sean Shah on the phone. Patient's identity was confirmed using two patient specific identifiers. We discussed his lab results. His CMP was normal. Lipid panel showed elevated triglycerides (not fasting), elevated LDL. Goal for him would be less than 100.   HbA1c today was found to be 6.6% categorizing him as T2DM. We discussed conservative management in the meantime as well as seeing [place] again for his follow up in order to discuss treatment strategies and care moving forward. Patient voiced understanding of this plan.

## 2023-06-07 ENCOUNTER — Other Ambulatory Visit (HOSPITAL_COMMUNITY): Payer: Self-pay

## 2023-08-24 ENCOUNTER — Other Ambulatory Visit (HOSPITAL_COMMUNITY): Payer: Self-pay

## 2023-12-10 ENCOUNTER — Encounter (HOSPITAL_COMMUNITY): Payer: Self-pay

## 2023-12-10 ENCOUNTER — Ambulatory Visit (HOSPITAL_COMMUNITY)
Admission: EM | Admit: 2023-12-10 | Discharge: 2023-12-10 | Disposition: A | Discharge status: Home / Self Care | Payer: No Typology Code available for payment source | Attending: Physician Assistant | Admitting: Physician Assistant

## 2023-12-10 DIAGNOSIS — R300 Dysuria: Secondary | ICD-10-CM | POA: Diagnosis present

## 2023-12-10 DIAGNOSIS — R310 Gross hematuria: Secondary | ICD-10-CM

## 2023-12-10 LAB — POCT URINALYSIS DIP (MANUAL ENTRY)
Bilirubin, UA: NEGATIVE
Glucose, UA: NEGATIVE mg/dL
Leukocytes, UA: NEGATIVE
Nitrite, UA: NEGATIVE
Protein Ur, POC: NEGATIVE mg/dL
Spec Grav, UA: 1.01 (ref 1.010–1.025)
Urobilinogen, UA: 0.2 U/dL
pH, UA: 6 (ref 5.0–8.0)

## 2023-12-10 LAB — COMPREHENSIVE METABOLIC PANEL
ALT: 24 U/L (ref 0–44)
AST: 26 U/L (ref 15–41)
Albumin: 4.1 g/dL (ref 3.5–5.0)
Alkaline Phosphatase: 68 U/L (ref 38–126)
Anion gap: 11 (ref 5–15)
BUN: 8 mg/dL (ref 6–20)
CO2: 25 mmol/L (ref 22–32)
Calcium: 9.2 mg/dL (ref 8.9–10.3)
Chloride: 101 mmol/L (ref 98–111)
Creatinine, Ser: 1.21 mg/dL (ref 0.61–1.24)
GFR, Estimated: >60 mL/min (ref >60)
Glucose, Bld: 87 mg/dL (ref 70–99)
Potassium: 3.7 mmol/L (ref 3.5–5.1)
Sodium: 137 mmol/L (ref 135–145)
Total Bilirubin: 0.8 mg/dL (ref 0.0–1.2)
Total Protein: 7.5 g/dL (ref 6.5–8.1)

## 2023-12-10 LAB — CK: Total CK: 474 U/L — ABNORMAL HIGH (ref 49–397)

## 2023-12-10 NOTE — ED Provider Notes (Signed)
[facility]    CSN: [id number] Arrival date & time: 12/10/23  0840      History   Chief Complaint No chief complaint on file.   HPI Sean Shah is a 53 y.o. male.   HPI  Dysuria with gross hematuria that started this AM   Associated symptoms: he denies flank pain, abdominal pain, fevers, chills  Concern for STD: denies concerns but is amenable for testing  Previous hx of kidney stones: denies  Excessive physical exertion: denies this  Muscle aches: none    Interventions: none      History reviewed. No pertinent past medical history.  Patient Active Problem List   Diagnosis Date Noted   Morbid obesity with body mass index (BMI) of 40.0 or higher (HCC) 05/26/2023   Onychomycosis 05/26/2023   Plantar warts 05/26/2023   Prediabetes 10/01/2021   Hyperlipidemia 04/10/2021   Erectile dysfunction 04/08/2021   Healthcare maintenance 04/08/2021   Hypertension 01/15/2021    History reviewed. No pertinent surgical history.     Home Medications    Prior to Admission medications   Medication Sig Start Date End Date Taking? Authorizing Provider  amLODipine-valsartan (EXFORGE) 10-320 MG tablet Take 1 tablet by mouth daily. 05/18/23  Yes Lovie Macadamia, MD  rosuvastatin (CRESTOR) 20 MG tablet Take 1 tablet (20 mg total) by mouth daily. 05/26/23  Yes Lovie Macadamia, MD  sildenafil (VIAGRA) 25 MG tablet Take 1 tablet (25 mg total) by mouth daily as needed for erectile dysfunction. 04/08/21  Yes Belva Agee, MD    Family History Family History  Problem Relation Age of Onset   Cancer Mother    Vascular Disease Father    Early death Neg Hx     Social History Social History   Tobacco Use   Smoking status: Never   Smokeless tobacco: Never  Substance Use Topics   Alcohol use: Yes    Comment: rarely   Drug use: Never     Allergies   Patient has no known allergies.   Review of Systems Review of Systems  Constitutional:  Negative  for chills and fever.  Gastrointestinal:  Negative for abdominal pain.  Genitourinary:  Positive for dysuria and hematuria. Negative for decreased urine volume, difficulty urinating, flank pain, frequency, penile discharge, penile pain, penile swelling, scrotal swelling, testicular pain and urgency.     Physical Exam Triage Vital Signs ED Triage Vitals [12/10/23 0916]  Encounter Vitals Group     BP (!) 146/89     Systolic BP Percentile      Diastolic BP Percentile      Pulse Rate 80     Resp 16     Temp 98.6 F (37 C)     Temp Source Oral     SpO2 98 %     Weight      Height      Head Circumference      Peak Flow      Pain Score      Pain Loc      Pain Education      Exclude from Growth Chart    No data found.  Updated Vital Signs BP (!) 146/89 (BP Location: Left Arm)   Pulse 80   Temp 98.6 F (37 C) (Oral)   Resp 16   SpO2 98%   Visual Acuity Right Eye Distance:   Left Eye Distance:   Bilateral Distance:    Right Eye Near:   Left Eye Near:    Bilateral  Near:     Physical Exam Vitals reviewed.  Constitutional:      General: He is awake.     Appearance: Normal appearance. He is well-developed and well-groomed. He is not ill-appearing, toxic-appearing or diaphoretic.  HENT:     Head: Normocephalic and atraumatic.  Cardiovascular:     Rate and Rhythm: Normal rate and regular rhythm.     Heart sounds: Normal heart sounds. No murmur heard.    No friction rub. No gallop.  Pulmonary:     Effort: Pulmonary effort is normal.     Breath sounds: Normal breath sounds.  Abdominal:     General: Abdomen is flat. Bowel sounds are normal.     Palpations: Abdomen is soft.     Tenderness: There is no abdominal tenderness. There is no right CVA tenderness or left CVA tenderness.  Neurological:     Mental Status: He is alert.  Psychiatric:        Attention and Perception: Attention normal.        Mood and Affect: Mood normal.        Speech: Speech normal.         Behavior: Behavior normal. Behavior is cooperative.        Thought Content: Thought content normal.        Judgment: Judgment normal.      UC Treatments / Results  Labs (all labs ordered are listed, but only abnormal results are displayed) Labs Reviewed  POCT URINALYSIS DIP (MANUAL ENTRY) - Abnormal; Notable for the following components:      Result Value   Color, UA other (*)    Clarity, UA cloudy (*)    Ketones, POC UA small (15) (*)    Blood, UA large (*)    All other components within normal limits  COMPREHENSIVE METABOLIC PANEL  CK  CYTOLOGY, (ORAL, ANAL, URETHRAL) ANCILLARY ONLY    EKG   Radiology No results found.  Procedures Procedures (including critical care time)  Medications Ordered in UC Medications - No data to display  Initial Impression / Assessment and Plan / UC Course  I have reviewed the triage vital signs and the nursing notes.  Pertinent labs & imaging results that were available during my care of the patient were reviewed by me and considered in my medical decision making (see chart for details).     Urine dip negative for leukocytes or nitrites. Positive for RBC.  Potential for kidney stones, STD, rhabdo Physical exam to dictate further management    Final Clinical Impressions(s) / UC Diagnoses   Final diagnoses:  Gross hematuria  Dysuria   Acute, new concern Patient reports single episode of gross hematuria and dysuria at the end of urine stream this AM Urine dip positive for blood and ketones. Will get CMP to check GFR and kidney function , CK to rule out rhabdo and cytology to rule out gonorrhea/ chlamydia  Results to dictate further management  Recommend going to PCP or ED for Ct renal stone study for rule out of nephrolithiasis, renal cysts, bladder mass, etc.  Discussed further actions with patient and he voiced understanding and agreement. Follow up as needed for persistent or progressing symptoms     Discharge Instructions       At this time I recommend following up with your PCP or going to the ED for a CT scan of your urinary tract. This can help [place] rule out kidney stones, renal cysts, bladder cancer and other structural complications. We  will keep you updated on the results of your labs once available. Please stay well hydrated and avoid things like NSAIDs while waiting on your results.      ED Prescriptions   None    PDMP not reviewed this encounter.   Dejai Schubach, Oswaldo Conroy, PA-C 12/10/23 1023

## 2023-12-10 NOTE — Discharge Instructions (Addendum)
At this time I recommend following up with your PCP or going to the ED for a CT scan of your urinary tract. This can help [place] rule out kidney stones, renal cysts, bladder cancer and other structural complications. We will keep you updated on the results of your labs once available. Please stay well hydrated and avoid things like NSAIDs while waiting on your results.

## 2023-12-10 NOTE — ED Triage Notes (Signed)
Pt reports dysuria that started this morning. Patient reports he is not able to urinate at this time. Patient provided with cup of water.

## 2023-12-13 LAB — CYTOLOGY, (ORAL, ANAL, URETHRAL) ANCILLARY ONLY
Chlamydia: NEGATIVE
Comment: NEGATIVE
Comment: NEGATIVE
Comment: NORMAL
Neisseria Gonorrhea: NEGATIVE
Trichomonas: NEGATIVE

## 2023-12-21 ENCOUNTER — Ambulatory Visit (INDEPENDENT_AMBULATORY_CARE_PROVIDER_SITE_OTHER): Payer: No Typology Code available for payment source | Admitting: Student

## 2023-12-21 ENCOUNTER — Other Ambulatory Visit (HOSPITAL_COMMUNITY): Payer: Self-pay

## 2023-12-21 VITALS — BP 156/94 | HR 65 | Temp 97.3°F | Ht 72.0 in | Wt 292.3 lb

## 2023-12-21 DIAGNOSIS — Z87448 Personal history of other diseases of urinary system: Secondary | ICD-10-CM | POA: Diagnosis not present

## 2023-12-21 DIAGNOSIS — I10 Essential (primary) hypertension: Secondary | ICD-10-CM

## 2023-12-21 DIAGNOSIS — E139 Other specified diabetes mellitus without complications: Secondary | ICD-10-CM

## 2023-12-21 DIAGNOSIS — E785 Hyperlipidemia, unspecified: Secondary | ICD-10-CM

## 2023-12-21 MED ORDER — AMLODIPINE BESYLATE-VALSARTAN 10-320 MG PO TABS
1.0000 | ORAL_TABLET | Freq: Every day | ORAL | 3 refills | Status: AC
  Filled 2023-12-21 – 2024-02-24 (×2): qty 90, 90d supply, fill #0
  Filled 2024-05-23: qty 90, 90d supply, fill #1
  Filled 2024-08-07: qty 90, 90d supply, fill #2
  Filled 2024-10-27: qty 90, 90d supply, fill #3

## 2023-12-21 MED ORDER — SPIRONOLACTONE 25 MG PO TABS
25.0000 mg | ORAL_TABLET | Freq: Every day | ORAL | 11 refills | Status: AC
  Filled 2023-12-21: qty 60, 60d supply, fill #0

## 2023-12-21 NOTE — Progress Notes (Signed)
CC: Annual wellness visit  HPI:  [name] is a 53 y.o. male living with a history stated below and presents today for annual wellness visit and to discuss his other chronic conditions. Please see problem based assessment and plan for additional details.  No past medical history on file.  Current Outpatient Medications on File Prior to Visit  Medication Sig Dispense Refill   rosuvastatin (CRESTOR) 20 MG tablet Take 1 tablet (20 mg total) by mouth daily. 30 tablet 3   sildenafil (VIAGRA) 25 MG tablet Take 1 tablet (25 mg total) by mouth daily as needed for erectile dysfunction. 20 tablet 0   No current facility-administered medications on file prior to visit.    Family History  Problem Relation Age of Onset   Cancer Mother    Vascular Disease Father    Early death Neg Hx     Social History   Socioeconomic History   Marital status: Single    Spouse name: Not on file   Number of children: Not on file   Years of education: Not on file   Highest education level: Not on file  Occupational History   Not on file  Tobacco Use   Smoking status: Never   Smokeless tobacco: Never  Substance and Sexual Activity   Alcohol use: Yes    Comment: rarely   Drug use: Never   Sexual activity: Yes  Other Topics Concern   Not on file  Social History Narrative   Not on file   Social Drivers of Health   Financial Resource Strain: Not on file  Food Insecurity: Not on file  Transportation Needs: Not on file  Physical Activity: Not on file  Stress: Not on file  Social Connections: Not on file  Intimate Partner Violence: Not on file    Review of Systems: ROS negative except for what is noted on the assessment and plan.  Vitals:   12/21/23 1532 12/21/23 1549  BP: (!) 145/90 (!) 156/94  Pulse: 69 65  Temp: (!) 97.3 F (36.3 C)   TempSrc: Oral   SpO2: 100%   Weight: 292 lb 4.8 oz (132.6 kg)   Height: 6' (1.829 m)     Physical Exam: Constitutional: well-appearing  man, sitting in chair, in no acute distress HENT: normocephalic atraumatic, mucous membranes moist Eyes: conjunctiva non-erythematous Cardiovascular: regular rate and rhythm, no m/r/g Pulmonary/Chest: normal work of breathing on room air, lungs clear to auscultation bilaterally Abdominal: soft, non-tender, non-distended MSK: normal bulk and tone Neurological: alert & oriented x 3, no focal deficit Skin: warm and dry Psych: normal mood and behavior  Assessment & Plan:   Hypertension BP Readings from Last 3 Encounters:  12/21/23 (!) 156/94  12/10/23 (!) 146/89  05/26/23 (!) 139/91   History of hypertension ,currently being managed with Exforge.  Patient reports adherence to his medication. BMI of 39.64. Blood pressure in the office today is 145/90.  Due to his cardiovascular risk in the setting of hyperlipidemia and morbid obesity, I think this patient will benefit from a more adequate blood pressure control.  Will add a thiazide to his current regimen and reassess his blood pressure in 3 to 4 months. CMP reviewed . -Continue Exforge -Start spironolactone 12.5 mg  Hyperlipidemia History of hyperlipidemia.  Last LDL of 176.  On Crestor 20 mg but patient reports he has not been taking this medication.He is able to afford it but he just does not like taking medications and chooses to modify his diet  and exercise.  Medical therapy benefits  with lipid control adequately discussed with the patient but patient declines to take any medications for his hyperlipidemia. -Encourage lifestyle modification and DASH diet  Diabetes (HCC) Newly diagnosed type 2 diabetes 6 months ago with A1c of 6.6 ,however patient elected lifestyle modification and dietary changes.  Benefits of medication therapy in controlling blood sugar adequately discussed with the patient but he declines medical therapy at this time. -Continue to encourage lifestyle modification -Offer medical therapy at next visit when  appropriate  Hx of hematuria Patient was recently seen in the ED for hematuria, was discharged without any intervention after the hematuria resolved. Patient reports he was urinating and felt a sharp pain after which his urine turned reddish so he went to the ED. Due to his history of kidney stone, it was thought that he had passed a stone resulting in hematuria at that time.  No further workup was done and patient was discharged home.  This patient's hematuria makes me worry if he has kidney stones versus bladder tumor versus prostatitis that need to be further assessed. I will repeat a UA today and if positive for microscopic hematuria, I will further evaluate with CT or referral for hematologic workup. - UA    Patient discussed with Dr. Joycelyn Das, M.D [facility] Phone: [phone] Date 12/21/2023 Time 4:51 PM

## 2023-12-21 NOTE — Assessment & Plan Note (Addendum)
Patient was recently seen in the ED for hematuria, was discharged without any intervention after the hematuria resolved. Patient reports he was urinating and felt a sharp pain after which his urine turned reddish so he went to the ED. Due to his history of kidney stone, it was thought that he had passed a stone resulting in hematuria at that time.  No further workup was done and patient was discharged home.  This patient's hematuria makes me worry if he has kidney stones versus bladder tumor versus prostatitis that need to be further assessed. I will repeat a UA today and if positive for microscopic hematuria, I will further evaluate with CT or referral for hematologic workup. - UA

## 2023-12-21 NOTE — Assessment & Plan Note (Addendum)
History of hyperlipidemia.  Last LDL of 176.  On Crestor 20 mg but patient reports he has not been taking this medication.He is able to afford it but he just does not like taking medications and chooses to modify his diet and exercise.  Medical therapy benefits  with lipid control adequately discussed with the patient but patient declines to take any medications for his hyperlipidemia. -Encourage lifestyle modification and DASH diet

## 2023-12-21 NOTE — Patient Instructions (Signed)
Thank you, [name] for allowing [place] to provide your care today.   Today we discussed your recent ED visit, your blood pressure, and your blood cholesterol.  You have elected to choose dietary modification and exercise to help control your blood cholesterol and blood sugar.  Your blood pressure however continues to be elevated at this time.  I am starting you on a new medication called spironolactone, please take this medication with your current blood pressure medication Exforge.  I will see you again in 3 to 4 months to further assess your blood pressure.  Please do not hesitate to call us if you decide to take medications or if you need help with your diet and exercise.  I have ordered the following labs for you:   Lab Orders         Microalbumin / Creatinine Urine Ratio      Tests ordered today:   Referrals ordered today:   Referral Orders  No referral(s) requested today     I have ordered the following medication/changed the following medications:   Stop the following medications: Medications Discontinued During This Encounter  Medication Reason   amLODipine-valsartan (EXFORGE) 10-320 MG tablet Reorder     Start the following medications: Meds ordered this encounter  Medications   spironolactone (ALDACTONE) 25 MG tablet    Sig: Take 1 tablet (25 mg total) by mouth daily.    Dispense:  60 tablet    Refill:  11   amLODipine-valsartan (EXFORGE) 10-320 MG tablet    Sig: Take 1 tablet by mouth daily.    Dispense:  90 tablet    Refill:  3     Follow up: 3-4 months   Remember:   Should you have any questions or concerns please call the internal medicine clinic at [phone].    Kathleen Lime, M.D [facility]

## 2023-12-21 NOTE — Assessment & Plan Note (Addendum)
Newly diagnosed type 2 diabetes 6 months ago with A1c of 6.6 ,however patient elected lifestyle modification and dietary changes.  Benefits of medication therapy in controlling blood sugar adequately discussed with the patient but he declines medical therapy at this time. -Continue to encourage lifestyle modification -Offer medical therapy at next visit when appropriate

## 2023-12-21 NOTE — Assessment & Plan Note (Addendum)
BP Readings from Last 3 Encounters:  12/21/23 (!) 156/94  12/10/23 (!) 146/89  05/26/23 (!) 139/91   History of hypertension ,currently being managed with Exforge.  Patient reports adherence to his medication. BMI of 39.64. Blood pressure in the office today is 145/90.  Due to his cardiovascular risk in the setting of hyperlipidemia and morbid obesity, I think this patient will benefit from a more adequate blood pressure control.  Will add a thiazide to his current regimen and reassess his blood pressure in 3 to 4 months. CMP reviewed . -Continue Exforge -Start spironolactone 12.5 mg

## 2023-12-22 LAB — URINALYSIS, ROUTINE W REFLEX MICROSCOPIC
Bilirubin, UA: NEGATIVE
Glucose, UA: NEGATIVE
Leukocytes,UA: NEGATIVE
Nitrite, UA: NEGATIVE
Protein,UA: NEGATIVE
RBC, UA: NEGATIVE
Specific Gravity, UA: 1.006 (ref 1.005–1.030)
Urobilinogen, Ur: 0.2 mg/dL (ref 0.2–1.0)
pH, UA: 5.5 (ref 5.0–7.5)

## 2023-12-22 NOTE — Progress Notes (Signed)
Patient called and his UA result discussed. His Hematuria has resolved and ketonuria has significantly improved compared to 2 weeks ago when seen in ED.  Benefits of medication compliance adequately discussed with the patient. Patient is grateful for the call and agrees to contact us if he has any other concerns before his next appointment.   Sean Shah.

## 2023-12-23 ENCOUNTER — Other Ambulatory Visit: Payer: Self-pay | Admitting: Student

## 2023-12-23 DIAGNOSIS — Z87448 Personal history of other diseases of urinary system: Secondary | ICD-10-CM

## 2023-12-23 NOTE — Progress Notes (Signed)
Internal Medicine Clinic Attending  Case discussed with the resident physician at the time of the visit.  We reviewed the patient's history, exam, and pertinent patient test results.  I agree with the assessment, diagnosis, and plan of care documented in the resident's note.

## 2023-12-23 NOTE — Progress Notes (Signed)
This is a 53 year old male with a history of hyperlipidemia and hypertension who presented to the ED after his urine turned reddish few days prior.  Patient reports he was urinating when he felt a sharp pain and then her urine turned red after the sharp pain.  He then went to the ED to get evaluated but no intervention was made during that time .The reddish urine was thought to be a stone that had passed. Patient presented to me on 12/21/2023 and reported complete resolution of his hematuria.  I wondered and worried if there was an underlying etiology for this gross hematuria that needed to be followed up.  I ordered a UA which was unremarkable and did not show any evidence of microscopic hematuria. I called patient back to discuss UA results and also explained why further evaluation is warranted at this time.  Will order CT hematuria studies and put a referral for urology evaluation.  Discussed this with the patient and he expressed concerns of cost involved in these evaluations.  He however agreed to get the CT done and see if there will be a need for urology evaluation.  I will follow-up with the patient once his CT result comes back.  Plan discussed with Dr Oswaldo Done.

## 2024-02-24 ENCOUNTER — Other Ambulatory Visit (HOSPITAL_COMMUNITY): Payer: Self-pay

## 2024-02-24 ENCOUNTER — Other Ambulatory Visit: Payer: Self-pay

## 2024-02-25 ENCOUNTER — Other Ambulatory Visit: Payer: Self-pay

## 2024-04-24 ENCOUNTER — Encounter: Payer: Self-pay | Admitting: *Deleted

## 2024-09-18 ENCOUNTER — Ambulatory Visit

## 2024-10-27 ENCOUNTER — Other Ambulatory Visit (HOSPITAL_COMMUNITY): Payer: Self-pay
